# Patient Record
Sex: Female | Born: 1985 | Race: White | Hispanic: No | Marital: Single | Smoking: Current every day smoker
Health system: Southern US, Community
[De-identification: ages and names within clinical notes are randomized; demographics above are authoritative.]

## PROBLEM LIST (undated history)

## (undated) DIAGNOSIS — N289 Disorder of kidney and ureter, unspecified: Secondary | ICD-10-CM

## (undated) HISTORY — PX: LITHOTRIPSY: SUR834

## (undated) HISTORY — PX: CHOLECYSTECTOMY: SHX55

---
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
## 2006-09-09 ENCOUNTER — Emergency Department: Payer: Self-pay | Admitting: Emergency Medicine

## 2006-09-23 ENCOUNTER — Emergency Department: Payer: Self-pay | Admitting: Emergency Medicine

## 2006-09-23 ENCOUNTER — Other Ambulatory Visit: Payer: Self-pay

## 2007-03-15 ENCOUNTER — Emergency Department: Payer: Self-pay | Admitting: Emergency Medicine

## 2007-04-04 ENCOUNTER — Emergency Department: Payer: Self-pay | Admitting: Emergency Medicine

## 2007-07-18 ENCOUNTER — Emergency Department: Payer: Self-pay

## 2007-12-08 ENCOUNTER — Emergency Department: Payer: Self-pay | Admitting: Emergency Medicine

## 2008-02-02 ENCOUNTER — Emergency Department: Payer: Self-pay | Admitting: Emergency Medicine

## 2008-06-29 ENCOUNTER — Emergency Department: Payer: Self-pay | Admitting: Emergency Medicine

## 2008-07-09 ENCOUNTER — Ambulatory Visit: Payer: Self-pay | Admitting: Urology

## 2008-07-14 ENCOUNTER — Ambulatory Visit: Payer: Self-pay | Admitting: Urology

## 2009-06-08 ENCOUNTER — Emergency Department: Payer: Self-pay | Admitting: Emergency Medicine

## 2009-09-02 ENCOUNTER — Emergency Department: Payer: Self-pay | Admitting: Emergency Medicine

## 2010-01-28 ENCOUNTER — Emergency Department: Payer: Self-pay | Admitting: Emergency Medicine

## 2010-02-10 ENCOUNTER — Ambulatory Visit: Payer: Self-pay | Admitting: Surgery

## 2010-02-11 ENCOUNTER — Ambulatory Visit: Payer: Self-pay | Admitting: Surgery

## 2010-02-14 LAB — PATHOLOGY REPORT

## 2010-10-20 ENCOUNTER — Emergency Department: Payer: Self-pay | Admitting: Emergency Medicine

## 2010-11-18 ENCOUNTER — Ambulatory Visit: Payer: Self-pay | Admitting: Urology

## 2011-01-01 ENCOUNTER — Emergency Department: Payer: Self-pay | Admitting: Internal Medicine

## 2011-02-19 ENCOUNTER — Emergency Department: Payer: Self-pay | Admitting: Emergency Medicine

## 2011-04-26 ENCOUNTER — Emergency Department: Payer: Self-pay | Admitting: Emergency Medicine

## 2011-04-27 ENCOUNTER — Emergency Department: Payer: Self-pay | Admitting: Emergency Medicine

## 2011-06-07 ENCOUNTER — Observation Stay: Payer: Self-pay

## 2011-08-13 ENCOUNTER — Inpatient Hospital Stay: Payer: Self-pay

## 2011-08-13 LAB — URINALYSIS, COMPLETE
Bilirubin,UR: NEGATIVE
Ketone: NEGATIVE
Leukocyte Esterase: NEGATIVE
Nitrite: NEGATIVE
Ph: 9 (ref 4.5–8.0)
Protein: 30
RBC,UR: 2 /HPF (ref 0–5)
Specific Gravity: 1.011 (ref 1.003–1.030)

## 2011-08-13 LAB — CBC WITH DIFFERENTIAL/PLATELET
Basophil #: 0.1 10*3/uL (ref 0.0–0.1)
Basophil %: 0.4 %
Eosinophil #: 0.1 10*3/uL (ref 0.0–0.7)
Eosinophil %: 0.7 %
HCT: 32 % — ABNORMAL LOW (ref 35.0–47.0)
HGB: 11 g/dL — ABNORMAL LOW (ref 12.0–16.0)
Lymphocyte %: 13 %
Monocyte %: 1.9 %
Neutrophil #: 12 10*3/uL — ABNORMAL HIGH (ref 1.4–6.5)
Neutrophil %: 84 %
RDW: 12.5 % (ref 11.5–14.5)
WBC: 14.3 10*3/uL — ABNORMAL HIGH (ref 3.6–11.0)

## 2011-08-13 LAB — DRUG SCREEN, URINE
Amphetamines, Ur Screen: NEGATIVE (ref ?–1000)
Barbiturates, Ur Screen: NEGATIVE (ref ?–200)
Benzodiazepine, Ur Scrn: NEGATIVE (ref ?–200)
MDMA (Ecstasy)Ur Screen: NEGATIVE (ref ?–500)
Methadone, Ur Screen: NEGATIVE (ref ?–300)
Opiate, Ur Screen: NEGATIVE (ref ?–300)
Phencyclidine (PCP) Ur S: NEGATIVE (ref ?–25)

## 2011-08-13 LAB — FETAL FIBRONECTIN: Fetal Fibronectin: POSITIVE

## 2011-08-15 LAB — BETA STREP CULTURE(ARMC)

## 2011-08-16 ENCOUNTER — Observation Stay: Payer: Self-pay

## 2011-08-16 LAB — COMPREHENSIVE METABOLIC PANEL
Alkaline Phosphatase: 143 U/L — ABNORMAL HIGH (ref 50–136)
BUN: 4 mg/dL — ABNORMAL LOW (ref 7–18)
Bilirubin,Total: 0.2 mg/dL (ref 0.2–1.0)
Co2: 28 mmol/L (ref 21–32)
EGFR (African American): >60
EGFR (Non-African Amer.): >60
Glucose: 91 mg/dL (ref 65–99)
Osmolality: 280 (ref 275–301)
Potassium: 2.8 mmol/L — ABNORMAL LOW (ref 3.5–5.1)
SGOT(AST): 41 U/L — ABNORMAL HIGH (ref 15–37)
SGPT (ALT): 36 U/L
Total Protein: 5.3 g/dL — ABNORMAL LOW (ref 6.4–8.2)

## 2011-08-16 LAB — URINALYSIS, COMPLETE
Bacteria: NONE SEEN
Bilirubin,UR: NEGATIVE
Glucose,UR: NEGATIVE mg/dL (ref 0–75)
Ketone: NEGATIVE
Specific Gravity: 1.003 (ref 1.003–1.030)
WBC UR: 3 /HPF (ref 0–5)

## 2011-09-02 ENCOUNTER — Observation Stay: Payer: Self-pay

## 2011-09-17 ENCOUNTER — Observation Stay: Payer: Self-pay | Admitting: *Deleted

## 2011-09-19 ENCOUNTER — Inpatient Hospital Stay: Payer: Self-pay | Admitting: Obstetrics and Gynecology

## 2011-09-19 LAB — RAPID URINE DRUG SCREEN, HOSP PERFORMED
Amphetamines, Ur Screen: NEGATIVE (ref ?–1000)
Benzodiazepine, Ur Scrn: NEGATIVE (ref ?–200)
Cannabinoid 50 Ng, Ur ~~LOC~~: POSITIVE (ref ?–50)

## 2011-09-19 LAB — CBC WITH DIFFERENTIAL/PLATELET
Basophil #: 0.2 10*3/uL — ABNORMAL HIGH (ref 0.0–0.1)
Eosinophil #: 0.1 10*3/uL (ref 0.0–0.7)
HCT: 31.2 % — ABNORMAL LOW (ref 35.0–47.0)
Lymphocyte #: 3 10*3/uL (ref 1.0–3.6)
Lymphocyte %: 16.9 %
MCHC: 34.1 g/dL (ref 32.0–36.0)
MCV: 93 fL (ref 80–100)
Neutrophil #: 13.8 10*3/uL — ABNORMAL HIGH (ref 1.4–6.5)
Platelet: 293 10*3/uL (ref 150–440)
RBC: 3.37 10*6/uL — ABNORMAL LOW (ref 3.80–5.20)
RDW: 13.1 % (ref 11.5–14.5)

## 2011-09-20 LAB — HEMATOCRIT: HCT: 28.9 % — ABNORMAL LOW (ref 35.0–47.0)

## 2012-10-03 ENCOUNTER — Emergency Department: Payer: Self-pay | Admitting: Emergency Medicine

## 2012-10-03 LAB — URINALYSIS, COMPLETE
Bilirubin,UR: NEGATIVE
Glucose,UR: NEGATIVE mg/dL (ref 0–75)
Nitrite: NEGATIVE
Ph: 6 (ref 4.5–8.0)
Protein: 100
Specific Gravity: 1.024 (ref 1.003–1.030)
Squamous Epithelial: 113

## 2012-10-03 LAB — COMPREHENSIVE METABOLIC PANEL
Albumin: 4 g/dL (ref 3.4–5.0)
Alkaline Phosphatase: 109 U/L (ref 50–136)
Anion Gap: 5 — ABNORMAL LOW (ref 7–16)
Calcium, Total: 8.7 mg/dL (ref 8.5–10.1)
EGFR (African American): >60
Glucose: 97 mg/dL (ref 65–99)
Potassium: 4.1 mmol/L (ref 3.5–5.1)
Sodium: 135 mmol/L — ABNORMAL LOW (ref 136–145)

## 2012-10-03 LAB — CBC
HGB: 15.2 g/dL (ref 12.0–16.0)
MCHC: 33.8 g/dL (ref 32.0–36.0)
RBC: 5.05 10*6/uL (ref 3.80–5.20)
RDW: 13.9 % (ref 11.5–14.5)

## 2012-12-14 ENCOUNTER — Emergency Department: Payer: Self-pay | Admitting: Unknown Physician Specialty

## 2012-12-14 LAB — COMPREHENSIVE METABOLIC PANEL
Albumin: 3.9 g/dL (ref 3.4–5.0)
Alkaline Phosphatase: 129 U/L (ref 50–136)
Anion Gap: 5 — ABNORMAL LOW (ref 7–16)
BUN: 15 mg/dL (ref 7–18)
Bilirubin,Total: 0.3 mg/dL (ref 0.2–1.0)
Calcium, Total: 9.1 mg/dL (ref 8.5–10.1)
Chloride: 107 mmol/L (ref 98–107)
Co2: 23 mmol/L (ref 21–32)
EGFR (African American): >60
EGFR (Non-African Amer.): >60
Glucose: 94 mg/dL (ref 65–99)
Potassium: 4 mmol/L (ref 3.5–5.1)
SGPT (ALT): 36 U/L (ref 12–78)
Sodium: 135 mmol/L — ABNORMAL LOW (ref 136–145)
Total Protein: 7.5 g/dL (ref 6.4–8.2)

## 2012-12-14 LAB — CBC
MCH: 30.5 pg (ref 26.0–34.0)
MCHC: 34 g/dL (ref 32.0–36.0)
Platelet: 304 10*3/uL (ref 150–440)
RBC: 4.87 10*6/uL (ref 3.80–5.20)
WBC: 9.4 10*3/uL (ref 3.6–11.0)

## 2012-12-14 LAB — URINALYSIS, COMPLETE
Bacteria: NONE SEEN
Bilirubin,UR: NEGATIVE
Glucose,UR: NEGATIVE mg/dL (ref 0–75)
Ketone: NEGATIVE
Nitrite: NEGATIVE
Ph: 6 (ref 4.5–8.0)
Protein: 30
Specific Gravity: 1.017 (ref 1.003–1.030)

## 2013-02-19 ENCOUNTER — Ambulatory Visit: Payer: Self-pay | Admitting: Urology

## 2013-02-26 ENCOUNTER — Emergency Department: Payer: Self-pay | Admitting: Emergency Medicine

## 2013-02-28 LAB — BETA STREP CULTURE(ARMC)

## 2013-08-21 ENCOUNTER — Emergency Department: Payer: Self-pay | Admitting: Emergency Medicine

## 2013-08-21 LAB — PREGNANCY, URINE: Pregnancy Test, Urine: NEGATIVE m[IU]/mL

## 2013-08-21 LAB — URINALYSIS, COMPLETE
BILIRUBIN, UR: NEGATIVE
GLUCOSE, UR: NEGATIVE mg/dL (ref 0–75)
Ketone: NEGATIVE
NITRITE: NEGATIVE
Ph: 7 (ref 4.5–8.0)
Protein: 30
RBC,UR: 107 /HPF (ref 0–5)
SPECIFIC GRAVITY: 1.016 (ref 1.003–1.030)
WBC UR: 7 /HPF (ref 0–5)

## 2013-08-21 LAB — CBC WITH DIFFERENTIAL/PLATELET
BASOS ABS: 0 10*3/uL (ref 0.0–0.1)
BASOS PCT: 0.4 %
EOS ABS: 0.2 10*3/uL (ref 0.0–0.7)
Eosinophil %: 2.3 %
HCT: 43.4 % (ref 35.0–47.0)
HGB: 14.4 g/dL (ref 12.0–16.0)
Lymphocyte #: 2.4 10*3/uL (ref 1.0–3.6)
Lymphocyte %: 36.7 %
MCH: 29.9 pg (ref 26.0–34.0)
MCHC: 33.2 g/dL (ref 32.0–36.0)
MCV: 90 fL (ref 80–100)
MONO ABS: 0.5 x10 3/mm (ref 0.2–0.9)
MONOS PCT: 7.7 %
NEUTROS PCT: 52.9 %
Neutrophil #: 3.5 10*3/uL (ref 1.4–6.5)
Platelet: 265 10*3/uL (ref 150–440)
RBC: 4.82 10*6/uL (ref 3.80–5.20)
RDW: 13.3 % (ref 11.5–14.5)
WBC: 6.7 10*3/uL (ref 3.6–11.0)

## 2013-08-21 LAB — COMPREHENSIVE METABOLIC PANEL
ALT: 25 U/L (ref 12–78)
ANION GAP: 6 — AB (ref 7–16)
AST: 17 U/L (ref 15–37)
Albumin: 3.8 g/dL (ref 3.4–5.0)
Alkaline Phosphatase: 120 U/L — ABNORMAL HIGH
BUN: 7 mg/dL (ref 7–18)
Bilirubin,Total: 0.3 mg/dL (ref 0.2–1.0)
CHLORIDE: 107 mmol/L (ref 98–107)
CO2: 25 mmol/L (ref 21–32)
CREATININE: 0.68 mg/dL (ref 0.60–1.30)
Calcium, Total: 8.9 mg/dL (ref 8.5–10.1)
GLUCOSE: 84 mg/dL (ref 65–99)
Osmolality: 273 (ref 275–301)
POTASSIUM: 3.7 mmol/L (ref 3.5–5.1)
Sodium: 138 mmol/L (ref 136–145)
Total Protein: 7.3 g/dL (ref 6.4–8.2)

## 2013-08-21 LAB — LIPASE, BLOOD: LIPASE: 95 U/L (ref 73–393)

## 2013-08-23 LAB — URINE CULTURE

## 2013-09-22 ENCOUNTER — Emergency Department: Payer: Self-pay | Admitting: Emergency Medicine

## 2013-09-22 LAB — BASIC METABOLIC PANEL
ANION GAP: 5 — AB (ref 7–16)
BUN: 11 mg/dL (ref 7–18)
CALCIUM: 8.8 mg/dL (ref 8.5–10.1)
CREATININE: 0.82 mg/dL (ref 0.60–1.30)
Chloride: 107 mmol/L (ref 98–107)
Co2: 24 mmol/L (ref 21–32)
EGFR (African American): >60
GLUCOSE: 99 mg/dL (ref 65–99)
Osmolality: 271 (ref 275–301)
POTASSIUM: 3.9 mmol/L (ref 3.5–5.1)
Sodium: 136 mmol/L (ref 136–145)

## 2013-09-22 LAB — CBC WITH DIFFERENTIAL/PLATELET
BASOS PCT: 0.8 %
Basophil #: 0.1 10*3/uL (ref 0.0–0.1)
Eosinophil #: 0.2 10*3/uL (ref 0.0–0.7)
Eosinophil %: 2.1 %
HCT: 43.8 % (ref 35.0–47.0)
HGB: 14.8 g/dL (ref 12.0–16.0)
LYMPHS ABS: 2.8 10*3/uL (ref 1.0–3.6)
LYMPHS PCT: 32.7 %
MCH: 30.7 pg (ref 26.0–34.0)
MCHC: 33.8 g/dL (ref 32.0–36.0)
MCV: 91 fL (ref 80–100)
MONOS PCT: 7.1 %
Monocyte #: 0.6 x10 3/mm (ref 0.2–0.9)
NEUTROS PCT: 57.3 %
Neutrophil #: 4.9 10*3/uL (ref 1.4–6.5)
Platelet: 285 10*3/uL (ref 150–440)
RBC: 4.82 10*6/uL (ref 3.80–5.20)
RDW: 13.9 % (ref 11.5–14.5)
WBC: 8.5 10*3/uL (ref 3.6–11.0)

## 2013-10-10 ENCOUNTER — Emergency Department: Payer: Self-pay | Admitting: Nurse Practitioner

## 2013-12-23 ENCOUNTER — Emergency Department: Payer: Self-pay | Admitting: Internal Medicine

## 2014-08-04 ENCOUNTER — Emergency Department: Payer: Self-pay | Admitting: Emergency Medicine

## 2014-08-04 LAB — CBC WITH DIFFERENTIAL/PLATELET
BASOS PCT: 0.6 %
Basophil #: 0 10*3/uL (ref 0.0–0.1)
EOS ABS: 0.1 10*3/uL (ref 0.0–0.7)
Eosinophil %: 1.1 %
HCT: 45 % (ref 35.0–47.0)
HGB: 15.2 g/dL (ref 12.0–16.0)
Lymphocyte #: 2.3 10*3/uL (ref 1.0–3.6)
Lymphocyte %: 31.3 %
MCH: 31.4 pg (ref 26.0–34.0)
MCHC: 33.8 g/dL (ref 32.0–36.0)
MCV: 93 fL (ref 80–100)
MONO ABS: 0.4 x10 3/mm (ref 0.2–0.9)
Monocyte %: 5.3 %
Neutrophil #: 4.5 10*3/uL (ref 1.4–6.5)
Neutrophil %: 61.7 %
Platelet: 258 10*3/uL (ref 150–440)
RBC: 4.84 10*6/uL (ref 3.80–5.20)
RDW: 13.8 % (ref 11.5–14.5)
WBC: 7.3 10*3/uL (ref 3.6–11.0)

## 2014-08-04 LAB — URINALYSIS, COMPLETE
BILIRUBIN, UR: NEGATIVE
GLUCOSE, UR: NEGATIVE mg/dL (ref 0–75)
KETONE: NEGATIVE
NITRITE: NEGATIVE
Ph: 7 (ref 4.5–8.0)
Protein: NEGATIVE
RBC,UR: 2 /HPF (ref 0–5)
SPECIFIC GRAVITY: 1.013 (ref 1.003–1.030)

## 2014-08-04 LAB — COMPREHENSIVE METABOLIC PANEL
ALBUMIN: 3.7 g/dL (ref 3.4–5.0)
ALT: 26 U/L (ref 14–63)
AST: 25 U/L (ref 15–37)
Alkaline Phosphatase: 100 U/L (ref 46–116)
Anion Gap: 8 (ref 7–16)
BILIRUBIN TOTAL: 0.3 mg/dL (ref 0.2–1.0)
BUN: 14 mg/dL (ref 7–18)
CREATININE: 0.82 mg/dL (ref 0.60–1.30)
Calcium, Total: 8.8 mg/dL (ref 8.5–10.1)
Chloride: 107 mmol/L (ref 98–107)
Co2: 23 mmol/L (ref 21–32)
EGFR (African American): >60
EGFR (Non-African Amer.): >60
GLUCOSE: 83 mg/dL (ref 65–99)
Osmolality: 275 (ref 275–301)
Potassium: 4.3 mmol/L (ref 3.5–5.1)
Sodium: 138 mmol/L (ref 136–145)
TOTAL PROTEIN: 7 g/dL (ref 6.4–8.2)

## 2014-08-04 LAB — LIPASE, BLOOD: Lipase: 145 U/L (ref 73–393)

## 2014-11-01 NOTE — Op Note (Signed)
PATIENT NAME:  Melissa Larson, Melissa Larson MR#:  [id number] DATE OF BIRTH:  [birth year]  DATE OF PROCEDURE:  09/19/2011  PREOPERATIVE DIAGNOSES:  1. Intrauterine pregnancy at [redacted] weeks gestation.  2. Fetal intolerance of labor.   POSTOPERATIVE DIAGNOSES: 1. Intrauterine pregnancy at [redacted] weeks gestation.  2. Fetal intolerance of labor.   PROCEDURE: Low transverse Cesarean section via Pfannenstiel skin incision.   ANESTHESIA: Epidural.   SURGEON: Conard NovakStephen D. Aaleah Hirsch, MD   ASSISTANT SURGEON: Dierdre Searles. Paul Harris, MD   ESTIMATED BLOOD LOSS: 750 mL.  OPERATIVE FLUIDS: 900 mL Crystalloid.   COMPLICATIONS: None.   FINDINGS:  1. Viable infant with Apgars of 9 at one minute and 9 at five minutes.  2. Normal appearing uterus, tubes, and ovaries.   SPECIMENS: None.   CONDITION: Stable.  INDICATIONS FOR PROCEDURE: The patient is a 29 year old gravida 1 at 37 weeks, 1 day gestation. She presented to [facility] today with rupture of membranes with no onset of labor. She was given a time period before augmentation was undertaken and she had no cervical change. During augmentation very little Pitocin was tolerated by the fetus with multiple decelerations to the 90's occurring at very low dose Pitocin. Intrauterine resuscitative measures were undertaken and the fetus always responded well to these and always responded well to fetal scalp stimulation. However, the patient was distant from delivery with prolonged decelerations lasting 7 or 8 minutes and only relieved by knee-chest positioning and terbutaline. Given this, the need for Cesarean delivery was explained to the patient. The risks and benefits were explained to her and the reasoning behind the need for the Cesarean section were explained. Her questions were answered and she agreed to go to the operating room for operative delivery.   PROCEDURE IN DETAIL: The patient was taken to the operating room where epidural anesthesia was administered and found to be adequate.  She was placed in the dorsal supine position with a leftward tilt. She was prepped and draped in the usual sterile fashion. A Pfannenstiel incision was made and carried through the various layers until the peritoneum was identified and entered sharply. The peritoneal incision was extended both cranially and caudally. The bladder blade was placed and a bladder flap was created and the bladder blade was replaced to separate the bladder from the operative area. A low transverse incision was made on the uterus and clear fluid was noted. The incision was extended with both cranial and caudal tension. The fetal vertex was grasped and delivered through the hysterotomy without difficulty followed by the shoulders and the rest of the body. The cord was clamped and cut and the infant was handed to the awaiting pediatrician. Cord blood was taken. The placenta was delivered spontaneously intact with a three vessel cord. The uterus was exteriorized and cleared of all clots and debris. The hysterotomy was closed using a #0 Vicryl in a running locked fashion. A second layer of the same suture was used to obtain hemostasis. One additional figure-of-eight stitch was needed to be thrown on the uterus itself to obtain hemostasis. The uterus was returned to the abdomen and the gutters were irrigated. The peritoneum was closed with #3-0 Vicryl in a running fashion.   The On-Q system at this point was then placed with one catheter placed approximately 4 cm above the skin incision and 1 cm right and lateral to the midline. A second catheter was placed 4 cm superior to the skin incision and 1 cm left of midline and thess  catheters were placed at the level just superficial to the rectus muscle. The catheters were inserted to approximately the third mark on the catheter.   The fascia was closed using #0 PDS with care not to incorporate the On-Q catheters. The skin was closed with staples. The On-Q catheters were bolused with 5 mL of 0.5%  Marcaine plain in each catheter.   The On-Q catheters were affixed to the skin using Dermabond at the site of insertion on both catheters. Once the Dermabond had dried, the catheters were then affixed to the skin using Steri-Strips and a Tegaderm was placed to further maintain the position of the catheters away from the incision.    The patient tolerated the procedure well. Sponge, lap, and needle counts were correct x2. She received preoperative antibiotics in the form of Ancef  2 grams prior to skin incision. She was taken from the operating room to recovery in stable condition.    ____________________________ Conard Novak, MD sdj:drc D: 09/19/2011 23:21:00 ET T: 09/20/2011 11:53:13 ET JOB#: [id number]  cc: Conard Novak, MD, <Dictator> Conard Novak MD ELECTRONICALLY SIGNED 10/01/2011 23:21

## 2014-11-01 NOTE — Consult Note (Signed)
    Maternal Age 29    Gravida 1    Para 0    Term 0    PreTerm 0    Abortion 0    Living 0    EDC 09-Oct-2011    Gestational Age (wks, days) 32 1/7    Gestation Single    Maternal Blood Type O    Maternal Rh Positive    Indirect Coombs Negative    Maternal HIV Negative    Maternal Syphilis Ab Nonreactive    Maternal Rubella Immune    Maternal HBsAg Negative    Maternal GBS Negative    Other Maternal Labs Chl- and gon -    Prenatal Care Adequate    Family/Social History Please see below consult for psych history     Additional Comments Called to consult on this female 7732 1/[redacted] week gestation age female thought to be approx 4lbs by u/s.  Mother is a 29yo G1PO with significant prenatal history of bipolar disorder, major depressive disorder, (currently no psychiatric medication), with psychogenic syncopal espisodes, currently smoking 1ppd of cigarettes, and using marijuana.  The mother, her sister in law, and I discussed the risks of premature birth. We reviewed the common causes for preterm labor. We discussed the mortality and morbidity risks of an infant born at 8832wks gestation age. We discussed neurodevelopmental outcomes, eye development, lung development, feeding difficulties, thermoregulation, antibiotic coverage, and length of stay. We discussed the initial resuscitation and the average NICU course of a [redacted] wk ga female. I answered all of mother???s questions and empowered her to continue to ask questions regarding her preterm labor and infant. We discussed her psychiatric conditions as well. She is currently stable off of medication and lives at home by herself. Her home environment is currently stable. I discussed that we would have social work involved in order to assess the safety of her environment for her new infant.  This mother was very appropriate and asked very detailed and pertinent questions. Despite her significant psychiatric history, this mother has very good  insight regarding her and her unborn infants health.Thank you for the opportunity to participate in this patients care.   Parental Contact: Parental Contact: The parents were informed at length regarding the infant's condition and plan.  Thank you: Thank you for this consult..  Electronic Signatures: Corliss ParishGaliote, Matteson Blue P (MD)  (Signed [phone]-Feb-13 15:24)  Authored: PREGNANCY and LABOR, ADDITIONAL COMMENTS, PARENTAL CONTACT, THANK YOU   Last Updated: 03-Feb-13 15:24 by Corliss ParishGaliote, Tamani Durney P (MD)

## 2014-11-17 NOTE — H&P (Signed)
L&D Evaluation:  History Expanded:   HPI 29 year old GG1 P0 with EDC=10/09/2011 by a 7week ultrasound presents with c/o pain in vagina for 2-3 hours. Baby has been active. Has had occasional vomiting thru pregnancy. Vomited once this am. [facility], [facility]. 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 LABS:O POS, RI, VI, HIV neg, RPR neg, and HBsag neg.    Patient's Medical History Bipolar/depressive disorder, obesity, CIN2, kidney stones    Patient's Surgical History Cholecystectomy 2011, lithotripsy 2010    Medications Pre Natal Vitamins    Allergies ASA-anaphyllaxis    Social History tobacco  drugs  +UDS for MJ. smokes 1 PPD (down from 1 PPD)    Family History Non-Contributory   ROS:   ROS see HPI   Exam:   Vital Signs stable    Urine Protein 1+    General no apparent distress    Mental Status clear    Abdomen gravid, non-tender    Edema no edema    Pelvic no external lesions, 1.5/75/-1.    Mebranes Intact    FHT normal rate with no decels    Ucx absent    Skin dry   Impression:   Impression No evidence of preterm labor, cervix unchanged from prior exams   Plan:   Plan EFM/NST, discharge    Comments f/u at office on 2/28 as previously scheduled   Electronic Signatures: Vella KohlerBrothers, Kynadie Yaun K (CNM)  (Signed 23-Feb-13 17:35)  Authored: L&D Evaluation   Last Updated: 23-Feb-13 17:35 by Vella KohlerBrothers, Yonah Tangeman K (CNM)

## 2014-11-17 NOTE — H&P (Signed)
L&D Evaluation:  History:   HPI 29 year old GG1 P0 with EDC=10/09/2011 by a 7week ultrasound presents with c/o lower abdominal menstrual cramping since last night. This AM also had a brown discharge. Baby has been active. Some dysuria yesterday. Denies diarrhea, unusual vaginal discharge or fever. Has had occasional vomiting thru pregnancy. Vomited once yesterday but able to tolerate regular diet since.[facility] care [facility], [facility]. 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 LABS:O POS, RI, VI, HIV neg, RPR neg, and HBsag neg.    Presents with abdominal pain, and spotting    Patient's Medical History Bipolar/depressive disorder, obesity, CIN2, kidney stones    Patient's Surgical History Cholecystectomy 2011, lithotripsy 2010    Medications Pre Natal Vitamins    Allergies ASA-anaphyllaxis    Social History tobacco  drugs  +UDS for MJ. smokes 1 PPD (down from 1 PPD)    Family History Non-Contributory   ROS:   ROS see HPI   Exam:   Vital Signs stable    Urine Protein 1+, 1.011, 2RBCs/HPF, 4WBC/HPF, no bacteria., 23 epi cells.    General no apparent distress    Mental Status clear    Chest clear    Heart normal sinus rhythm, no murmur/gallop/rubs    Abdomen gravid, non-tender    Estimated Fetal Weight On USCGA=32wk1day, EFW4#6oz    Fetal Position cephalic on US    Edema no edema    Reflexes 1+    Pelvic no external lesions, 1.5/60-70%/-1. wet prep negative.    Mebranes Intact, AFI=12cm    FHT 135 with accels to 150s to 170, mod variability    FHT Description occ mild variables x 10-30 sec with ctxs?    Ucx irregular, q10 min, difficult to see with toco    Skin dry   Impression:   Impression IUP at 31 6/7  weeks with preterm labor.   Plan:   Plan EFM/NST, monitor contractions and for cervical change, FFN, Aptima, GBS culture done. Magnesium sulfate tocolysis. GBS prophyllaxis.  BMZ. Notified neonatal and Dr Bonney AidStaebler of admission..    Comments FFN came back positive.   Electronic Signatures: Trinna BalloonGutierrez, Matan Steen L (CNM)  (Signed 03-Feb-13 14:33)  Authored: L&D Evaluation   Last Updated: 03-Feb-13 14:33 by Trinna BalloonGutierrez, Ithan Touhey L (CNM)

## 2014-11-17 NOTE — H&P (Signed)
L&D Evaluation:  History:   HPI 29 yo G1 at 2742w1d by first trimester ultrasound dating.  Pregnancy complicated by late entry to care, abnormal pap smear (CIN2 on colposcopy), smoking, + marijuana use during pregnancy, preterm labor at 32 weeks (now s/p betamethasone), Depression, obesity with BMI >40 weeks. She presents with concern for rupture of membranes today at noon.  Clear fluid.  +FM, no vaginal bleeding, she is unsure if she is having contractions.  O+, RI, RPR NR, HBsAg neg, VZ I, GBS pos.    Presents with leaking fluid    Patient's Medical History obesity, Major depression/bipolar, abnormal pap smear    Patient's Surgical History laparoscopic Cholecystectomy, lithotripsy    Medications Pre Natal Vitamins    Allergies aspirin-throat swelling    Social History tobacco  drugs  +marijuana use    Family History Non-Contributory   ROS:   ROS All systems were reviewed.  HEENT, CNS, GI, GU, Respiratory, CV, Renal and Musculoskeletal systems were found to be normal.   Exam:   Vital Signs stable  AFVSS    General no apparent distress    Mental Status clear    Chest clear    Abdomen gravid, non-tender    Estimated Fetal Weight Average for gestational age    Fetal Position cephalic    Back no CVAT    Edema no edema    Pelvic no external lesions, 3/70/-1 (per RN)    Mebranes Ruptured    Description clear    FHT normal rate with no decels    FHT Description Variable decelerations, 135/+accels/no decels/mod variability    Ucx irregular, infrequent    Skin no lesions, multiple leg tattoos    Lymph no lymphadenopathy   Impression:   Impression PROM   Plan:   Plan EFM/NST, monitor contractions and for cervical change, monitor BP, antibiotics for GBBS prophylaxis, fluids    Comments admit expectant management for a few hours. if no labor, will augment clear liquid diet t&s/cbc   Electronic Signatures: Conard NovakJackson, Stefano Trulson D (MD)  (Signed 12-Mar-13  14:11)  Authored: L&D Evaluation   Last Updated: 12-Mar-13 14:11 by Conard NovakJackson, Epimenio Schetter D (MD)

## 2015-01-28 ENCOUNTER — Encounter: Payer: Self-pay | Admitting: Emergency Medicine

## 2015-01-28 ENCOUNTER — Emergency Department
Admission: EM | Admit: 2015-01-28 | Discharge: 2015-01-28 | Disposition: A | Discharge status: Home / Self Care | Payer: Self-pay | Attending: Emergency Medicine | Admitting: Emergency Medicine

## 2015-01-28 DIAGNOSIS — L02412 Cutaneous abscess of left axilla: Secondary | ICD-10-CM | POA: Insufficient documentation

## 2015-01-28 DIAGNOSIS — Z720 Tobacco use: Secondary | ICD-10-CM | POA: Insufficient documentation

## 2015-01-28 MED ORDER — SULFAMETHOXAZOLE-TRIMETHOPRIM 800-160 MG PO TABS: 1.0000 | ORAL_TABLET | Freq: Two times a day (BID) | ORAL | Status: AC

## 2015-01-28 MED ORDER — KETOROLAC TROMETHAMINE 10 MG PO TABS
ORAL_TABLET | ORAL | Status: AC
  Filled 2015-01-28: qty 1

## 2015-01-28 MED ORDER — OXYCODONE-ACETAMINOPHEN 5-325 MG PO TABS
1.0000 | ORAL_TABLET | Freq: Once | ORAL | Status: AC
  Administered 2015-01-28: 1 via ORAL
  Filled 2015-01-28: qty 1

## 2015-01-28 MED ORDER — SULFAMETHOXAZOLE-TRIMETHOPRIM 800-160 MG PO TABS
2.0000 | ORAL_TABLET | Freq: Once | ORAL | Status: AC
  Administered 2015-01-28: 2 via ORAL
  Filled 2015-01-28: qty 2

## 2015-01-28 NOTE — ED Notes (Signed)
Patient ambulatory to triage with steady gait, without difficulty or distress noted; pt st noted bump to left axillae, now with redness and pain; denies hx of same

## 2015-01-28 NOTE — Discharge Instructions (Signed)

## 2015-01-28 NOTE — ED Provider Notes (Signed)
[facility] Emergency Department Provider Note  ____________________________________________  Time seen: 3:00 AM  I have reviewed the triage vital signs and the nursing notes.   HISTORY  Chief Complaint Abscess     HPI Melissa Larson is a 29 y.o. female resents with left axilla abscess 2 days. Patient stated tender swollen area with greenish yellow drainage which she noticed proximally 2 days ago. Patient denies any fever no nausea no vomiting. In addition patient denies any previous abscess history   Past Medical history None There are no active problems to display for this patient.   Past Surgical History  Procedure Laterality Date  . Lithotripsy    . Cholecystectomy    . Cesarean section      No current outpatient prescriptions on file.  Allergies Aspirin  No family history on file.  Social History History  Substance Use Topics  . Smoking status: Current Every Day Smoker -- 0.50 packs/day    Types: Cigarettes  . Smokeless tobacco: Not on file  . Alcohol Use: No    Review of Systems  Constitutional: Negative for fever. Eyes: Negative for visual changes. ENT: Negative for sore throat. Cardiovascular: Negative for chest pain. Respiratory: Negative for shortness of breath. Gastrointestinal: Negative for abdominal pain, vomiting and diarrhea. Genitourinary: Negative for dysuria. Musculoskeletal: Negative for back pain. Skin: Positive for abscess left axilla Neurological: Negative for headaches, focal weakness or numbness.   10-point ROS otherwise negative.  ____________________________________________   PHYSICAL EXAM:  VITAL SIGNS: ED Triage Vitals  Enc Vitals Group     BP 01/28/15 0050 119/88 mmHg     Pulse Rate 01/28/15 0050 73     Resp 01/28/15 0050 18     Temp 01/28/15 0050 98.2 F (36.8 C)     Temp Source 01/28/15 0050 Oral     SpO2 01/28/15 0050 99 %     Weight 01/28/15 0050 215 lb (97.523 kg)     Height  01/28/15 0050 4\' 7"  (1.397 m)     Head Cir --      Peak Flow --      Pain Score 01/28/15 0050 10     Pain Loc --      Pain Edu? --      Excl. in GC? --      Constitutional: Alert and oriented. Well appearing and in no distress. Eyes: Conjunctivae are normal. PERRL. Normal extraocular movements. ENT   Head: Normocephalic and atraumatic.   Nose: No congestion/rhinnorhea.   Mouth/Throat: Mucous membranes are moist.   Neck: No stridor. Cardiovascular: Normal rate, regular rhythm. Normal and symmetric distal pulses are present in all extremities. No murmurs, rubs, or gallops. Respiratory: Normal respiratory effort without tachypnea nor retractions. Breath sounds are clear and equal bilaterally. No wheezes/rales/rhonchi. Gastrointestinal: Soft and nontender. No distention. There is no CVA tenderness. Genitourinary: deferred Musculoskeletal: Nontender with normal range of motion in all extremities. No joint effusions.  No lower extremity tenderness nor edema. Neurologic:  Normal speech and language. No gross focal neurologic deficits are appreciated. Speech is normal.  Skin:  Left axilla lesion noted with surrounding erythema and central purulent drainage consistent with draining abscess. It surrounding cellulitis entire diameter approximately 0.5 cm. Psychiatric: Mood and affect are normal. Speech and behavior are normal. Patient exhibits appropriate insight and judgment.  ____________________________________________      INITIAL IMPRESSION / ASSESSMENT AND PLAN / ED COURSE  Pertinent labs & imaging results that were available during my care of the patient  were reviewed by me and considered in my medical decision making (see chart for details).  History of physical exam consistent with a draining abscess of the left axilla. As such I&D was not performed patient received Bactrim DS and will be prescribed the same at  home.  ____________________________________________   FINAL CLINICAL IMPRESSION(S) / ED DIAGNOSES  Final diagnoses:  Abscess of left axilla      Darci Current, MD 01/28/15 [phone]

## 2015-02-13 ENCOUNTER — Emergency Department
Admission: EM | Admit: 2015-02-13 | Discharge: 2015-02-13 | Disposition: A | Discharge status: Home / Self Care | Payer: Self-pay | Attending: Emergency Medicine | Admitting: Emergency Medicine

## 2015-02-13 ENCOUNTER — Encounter: Payer: Self-pay | Admitting: Emergency Medicine

## 2015-02-13 ENCOUNTER — Emergency Department: Payer: Self-pay

## 2015-02-13 DIAGNOSIS — G5761 Lesion of plantar nerve, right lower limb: Secondary | ICD-10-CM | POA: Insufficient documentation

## 2015-02-13 DIAGNOSIS — Z720 Tobacco use: Secondary | ICD-10-CM | POA: Insufficient documentation

## 2015-02-13 MED ORDER — NAPROXEN 500 MG PO TABS: 500.0000 mg | ORAL_TABLET | Freq: Two times a day (BID) | ORAL | Status: DC

## 2015-02-13 NOTE — ED Notes (Signed)
States she woke up with pain to the top of right foot.Marland Kitchendenies any recent injury  But states pain is increased bearing wt. No swelling noted

## 2015-02-13 NOTE — ED Provider Notes (Signed)
[facility] Emergency Department Provider Note  ____________________________________________  Time seen: Approximately 1:41 PM  I have reviewed the triage vital signs and the nursing notes.   HISTORY  Chief Complaint Foot Pain    HPI Melissa Larson is a 29 y.o. female patient complaining of acute onset of right plantar foot pain between the first and second metatarsal head. Patient stated this occurred upon awakening this morning. Patient denies any history of injury. He denies any increased physical activity. Patient denies any swelling or redness. . Patient state pain with ambulation. She rated the pain as 6/10. Patient describes the pain as sharp. State pain is slightly deviated by walking on the lateral aspect of her foot.  History reviewed. No pertinent past medical history.  There are no active problems to display for this patient.   Past Surgical History  Procedure Laterality Date  . Lithotripsy    . Cholecystectomy    . Cesarean section      Current Outpatient Rx  Name  Route  Sig  Dispense  Refill  . naproxen (NAPROSYN) 500 MG tablet   Oral   Take 1 tablet (500 mg total) by mouth 2 (two) times daily with a meal.   20 tablet   0     Allergies Aspirin  History reviewed. No pertinent family history.  Social History History  Substance Use Topics  . Smoking status: Current Every Day Smoker -- 0.50 packs/day    Types: Cigarettes  . Smokeless tobacco: Not on file  . Alcohol Use: No    Review of Systems Constitutional: No fever/chills Eyes: No visual changes. ENT: No sore throat. Cardiovascular: Denies chest pain. Respiratory: Denies shortness of breath. Gastrointestinal: No abdominal pain.  No nausea, no vomiting.  No diarrhea.  No constipation. Genitourinary: Negative for dysuria. Musculoskeletal: Right plantar foot pain. Skin: Negative for rash. Neurological: Negative for headaches, focal weakness or numbness. 10-point ROS  otherwise negative.  ____________________________________________   PHYSICAL EXAM:  VITAL SIGNS: ED Triage Vitals  Enc Vitals Group     BP 02/13/15 1327 120/73 mmHg     Pulse Rate 02/13/15 1327 84     Resp 02/13/15 1327 20     Temp 02/13/15 1327 98.1 F (36.7 C)     Temp Source 02/13/15 1327 Oral     SpO2 02/13/15 1327 98 %     Weight 02/13/15 1327 215 lb (97.523 kg)     Height 02/13/15 1327  (1.397 m)     Head Cir --      Peak Flow --      Pain Score 02/13/15 1326 6     Pain Loc --      Pain Edu? --      Excl. in GC? --     Constitutional: Alert and oriented. Well appearing and in no acute distress. Eyes: Conjunctivae are normal. PERRL. EOMI. Head: Atraumatic. Nose: No congestion/rhinnorhea. Mouth/Throat: Mucous membranes are moist.  Oropharynx non-erythematous. Neck: No stridor.  No cervical spine tenderness to palpation. Hematological/Lymphatic/Immunilogical: No cervical lymphadenopathy. Cardiovascular: Normal rate, regular rhythm. Grossly normal heart sounds.  Good peripheral circulation. Respiratory: Normal respiratory effort.  No retractions. Lungs CTAB. Gastrointestinal: Soft and nontender. No distention. No abdominal bruits. No CVA tenderness. Musculoskeletal: No obvious edema or erythema to the plantar aspect right foot. Patient has some moderate guarding palpation between the first and second metatarsal head. Neurovascular intact free nuchal range of motion.  Neurologic:  Normal speech and language. No gross focal neurologic  deficits are appreciated. No gait instability. Skin:  Skin is warm, dry and intact. No rash noted. Psychiatric: Mood and affect are normal. Speech and behavior are normal.  ____________________________________________   LABS (all labs ordered are listed, but only abnormal results are displayed)  Labs Reviewed - No data to  display ____________________________________________  EKG   ____________________________________________  RADIOLOGY  No acute finding on x-ray.  I, Joni Reining, personally viewed and evaluated these images as part of my medical decision making.   ____________________________________________   PROCEDURES  Procedure(s) performed: None  Critical Care performed: No  ____________________________________________   INITIAL IMPRESSION / ASSESSMENT AND PLAN / ED COURSE  Pertinent labs & imaging results that were available during my care of the patient were reviewed by me and considered in my medical decision making (see chart for details). Right plantar foot pain. Discuss x-ray results with patient. Advised patient to follow-up with podiatrist if condition continues and received anti-inflammatory medications. ____________________________________________   FINAL CLINICAL IMPRESSION(S) / ED DIAGNOSES  Final diagnoses:  Morton neuroma, right      Joni Reining, PA-C 02/13/15 1444  Joni Reining, PA-C 02/13/15 1448  Myrna Blazer, MD 02/13/15 [phone]

## 2015-02-13 NOTE — Discharge Instructions (Signed)
Morton's Neuroma Neuralgia (nerve pain) or neuroma (benign [non-cancerous] nerve tumor) may develop on any interdigital nerve. The interdigital nerves (nerves between digits) of the foot travel beneath and between the metatarsals (long bones of the fore foot) and pass the nerve endings to the toes. The third interdigital is a common place for a small neuroma to form called Morton's neuroma. Another nerve to be affected commonly is the fourth interdigital nerve. This would be in approximately in the area of the base or ball under the bottom of your fourth toe. This condition occurs more commonly in women and is usually on one side. It is usually first noticed by pain radiating (spreading) to the ball of the foot or to the toes. CAUSES The cause of interdigital neuralgia may be from low grade repetitive trauma (damage caused by an accident) as in activities causing a repeated pounding of the foot (running, jumping etc.). It is also caused by improper footwear or recent loss of the fatty padding on the bottom of the foot. TREATMENT  The condition often resolves (goes away) simply with decreasing activity if that is thought to be the cause. Proper shoes are beneficial. Orthotics (special foot support aids) such as a metatarsal bar are often beneficial. This condition usually responds to conservative therapy, however if surgery is necessary it usually brings complete relief. HOME CARE INSTRUCTIONS   Apply ice to the area of soreness for 15-20 minutes, 03-04 times per day, while awake for the first 2 days. Put ice in a plastic bag and place a towel between the bag of ice and your skin.  Only take over-the-counter or prescription medicines for pain, discomfort, or fever as directed by your caregiver. MAKE SURE YOU:   Understand these instructions.  Will watch your condition.  Will get help right away if you are not doing well or get worse. Document Released: 10/02/2000 Document Revised: 09/18/2011  Document Reviewed: 08/27/2013 [employer] Patient Information 2015 [employer]. This information is not intended to replace advice given to you by your health care provider. Make sure you discuss any questions you have with your health care provider.  

## 2015-02-13 NOTE — ED Notes (Signed)
Patient to ED with c/o right foot pain, reports waking up like that this morning and pain is worse when she puts pressure on it.

## 2015-02-15 ENCOUNTER — Emergency Department
Admission: EM | Admit: 2015-02-15 | Discharge: 2015-02-15 | Disposition: A | Discharge status: Home / Self Care | Payer: Self-pay | Attending: Emergency Medicine | Admitting: Emergency Medicine

## 2015-02-15 ENCOUNTER — Encounter: Payer: Self-pay | Admitting: Emergency Medicine

## 2015-02-15 DIAGNOSIS — J069 Acute upper respiratory infection, unspecified: Secondary | ICD-10-CM | POA: Insufficient documentation

## 2015-02-15 DIAGNOSIS — K644 Residual hemorrhoidal skin tags: Secondary | ICD-10-CM | POA: Insufficient documentation

## 2015-02-15 DIAGNOSIS — Z720 Tobacco use: Secondary | ICD-10-CM | POA: Insufficient documentation

## 2015-02-15 MED ORDER — HYDROCORTISONE 2.5 % RE CREA
TOPICAL_CREAM | RECTAL | Status: AC
Start: 2015-02-15 — End: 2016-02-15

## 2015-02-15 MED ORDER — GUAIFENESIN-CODEINE 100-10 MG/5ML PO SOLN: 10.0000 mL | ORAL | Status: AC | PRN

## 2015-02-15 MED ORDER — CHLORPHENIRAMINE MALEATE 4 MG PO TABS: 4.0000 mg | ORAL_TABLET | Freq: Two times a day (BID) | ORAL | Status: AC | PRN

## 2015-02-15 NOTE — ED Notes (Signed)
States she has had runny nose  Sore throat  Body aches for several days

## 2015-02-15 NOTE — ED Provider Notes (Signed)
[facility] Emergency Department Provider Note  ____________________________________________  Time seen: Approximately 3:39 PM  I have reviewed the triage vital signs and the nursing notes.   HISTORY  Chief Complaint URI   HPI Melissa Larson is a 29 y.o. female who presents with a one-day history of cough and runny nose bodyaches sore throat. Also complains of a 6 week history of burning in her "butt". Patient denies any bloody stools. Patient states that she's been having hot and cold flashes.   History reviewed. No pertinent past medical history.  There are no active problems to display for this patient.   Past Surgical History  Procedure Laterality Date  . Lithotripsy    . Cholecystectomy    . Cesarean section      Current Outpatient Rx  Name  Route  Sig  Dispense  Refill  . chlorpheniramine (CHLOR-TRIMETON) 4 MG tablet   Oral   Take 1 tablet (4 mg total) by mouth 2 (two) times daily as needed for allergies or rhinitis.   30 tablet   0   . guaiFENesin-codeine 100-10 MG/5ML syrup   Oral   Take 10 mLs by mouth every 4 (four) hours as needed for cough.   180 mL   0   . hydrocortisone (ANUSOL-HC) 2.5 % rectal cream      Apply rectally 2 times daily   30 g   1   . naproxen (NAPROSYN) 500 MG tablet   Oral   Take 1 tablet (500 mg total) by mouth 2 (two) times daily with a meal.   20 tablet   0     Allergies Aspirin  No family history on file.  Social History History  Substance Use Topics  . Smoking status: Current Every Day Smoker -- 0.50 packs/day    Types: Cigarettes  . Smokeless tobacco: Not on file  . Alcohol Use: No    Review of Systems Constitutional: No fever/chills. Positive for hot and cold flashes. Eyes: No visual changes. ENT: No sore throat. Cardiovascular: Denies chest pain. Respiratory: Denies shortness of breath. Positive for cough Gastrointestinal: No abdominal pain.  No nausea, no vomiting.  No diarrhea.   No constipation. Positive for pain and rectal area. Genitourinary: Negative for dysuria. Musculoskeletal: Negative for back pain. Skin: Negative for rash. Neurological: Negative for headaches, focal weakness or numbness.  10-point ROS otherwise negative.  ____________________________________________   PHYSICAL EXAM:  VITAL SIGNS: ED Triage Vitals  Enc Vitals Group     BP --      Pulse Rate 02/15/15 1537 69     Resp 02/15/15 1537 20     Temp 02/15/15 1537 98.7 F (37.1 C)     Temp Source 02/15/15 1537 Oral     SpO2 02/15/15 1537 98 %     Weight 02/15/15 1537 215 lb (97.523 kg)     Height 02/15/15 1537  (1.397 m)     Head Cir --      Peak Flow --      Pain Score 02/15/15 1538 6     Pain Loc --      Pain Edu? --      Excl. in GC? --     Constitutional: Alert and oriented. Well appearing and in no acute distress. Eyes: Conjunctivae are normal. PERRL. EOMI. Head: Atraumatic. Nose: No congestion/rhinnorhea. Mouth/Throat: Mucous membranes are moist.  Oropharynx non-erythematous. Neck: No stridor.   Cardiovascular: Normal rate, regular rhythm. Grossly normal heart sounds.  Good peripheral  circulation. Respiratory: Normal respiratory effort.  No retractions. Lungs CTAB. Gastrointestinal: Soft and nontender. No distention. No abdominal bruits. No CVA tenderness. Rectal exam demonstrates 1 external hemorrhoid. Musculoskeletal: No lower extremity tenderness nor edema.  No joint effusions. Neurologic:  Normal speech and language. No gross focal neurologic deficits are appreciated. No gait instability. Skin:  Skin is warm, dry and intact. No rash noted. Psychiatric: Mood and affect are normal. Speech and behavior are normal.  ____________________________________________   LABS (all labs ordered are listed, but only abnormal results are displayed)  Labs Reviewed - No data to display ____________________________________________    PROCEDURES  Procedure(s) performed:  None  Critical Care performed: No  ____________________________________________   INITIAL IMPRESSION / ASSESSMENT AND PLAN / ED COURSE  Pertinent labs & imaging results that were available during my care of the patient were reviewed by me and considered in my medical decision making (see chart for details).  Acute URI. External hemorrhoid. Rx given for Tessalon Perles 100 mg 3 times a day, chlorpheniramine 4 mg every 6 hours. Anusol HC cream. Follow up with PCP if symptoms get worse or return to the ER if needed. She voices no other emergency medical complaints at this visit. ____________________________________________   FINAL CLINICAL IMPRESSION(S) / ED DIAGNOSES  Final diagnoses:  URI, acute  External hemorrhoid      Evangeline Dakin, PA-C 02/15/15 1553  Maurilio Lovely, MD 02/16/15 0005

## 2015-02-15 NOTE — Discharge Instructions (Signed)
Cough, Adult  A cough is a reflex that helps clear your throat and airways. It can help heal the body or may be a reaction to an irritated airway. A cough may only last 2 or 3 weeks (acute) or may last more than 8 weeks (chronic).  CAUSES Acute cough:  Viral or bacterial infections. Chronic cough:  Infections.  Allergies.  Asthma.  Post-nasal drip.  Smoking.  Heartburn or acid reflux.  Some medicines.  Chronic lung problems (COPD).  Cancer. SYMPTOMS   Cough.  Fever.  Chest pain.  Increased breathing rate.  High-pitched whistling sound when breathing (wheezing).  Colored mucus that you cough up (sputum). TREATMENT   A bacterial cough may be treated with antibiotic medicine.  A viral cough must run its course and will not respond to antibiotics.  Your caregiver may recommend other treatments if you have a chronic cough. HOME CARE INSTRUCTIONS   Only take over-the-counter or prescription medicines for pain, discomfort, or fever as directed by your caregiver. Use cough suppressants only as directed by your caregiver.  Use a cold steam vaporizer or humidifier in your bedroom or home to help loosen secretions.  Sleep in a semi-upright position if your cough is worse at night.  Rest as needed.  Stop smoking if you smoke. SEEK IMMEDIATE MEDICAL CARE IF:   You have pus in your sputum.  Your cough starts to worsen.  You cannot control your cough with suppressants and are losing sleep.  You begin coughing up blood.  You have difficulty breathing.  You develop pain which is getting worse or is uncontrolled with medicine.  You have a fever. MAKE SURE YOU:   Understand these instructions.  Will watch your condition.  Will get help right away if you are not doing well or get worse. Document Released: 12/23/2010 Document Revised: 09/18/2011 Document Reviewed: 12/23/2010 [facility] Patient Information 2015 [place], [place]. This information is not intended  to replace advice given to you by your health care provider. Make sure you discuss any questions you have with your health care provider.  Hemorrhoids Hemorrhoids are swollen veins around the rectum or anus. There are two types of hemorrhoids:   Internal hemorrhoids. These occur in the veins just inside the rectum. They may poke through to the outside and become irritated and painful.  External hemorrhoids. These occur in the veins outside the anus and can be felt as a painful swelling or hard lump near the anus. CAUSES  Pregnancy.   Obesity.   Constipation or diarrhea.   Straining to have a bowel movement.   Sitting for long periods on the toilet.  Heavy lifting or other activity that caused you to strain.  Anal intercourse. SYMPTOMS   Pain.   Anal itching or irritation.   Rectal bleeding.   Fecal leakage.   Anal swelling.   One or more lumps around the anus.  DIAGNOSIS  Your caregiver may be able to diagnose hemorrhoids by visual examination. Other examinations or tests that may be performed include:   Examination of the rectal area with a gloved hand (digital rectal exam).   Examination of anal canal using a small tube (scope).   A blood test if you have lost a significant amount of blood.  A test to look inside the colon (sigmoidoscopy or colonoscopy). TREATMENT Most hemorrhoids can be treated at home. However, if symptoms do not seem to be getting better or if you have a lot of rectal bleeding, your caregiver may perform a  procedure to help make the hemorrhoids get smaller or remove them completely. Possible treatments include:   Placing a rubber band at the base of the hemorrhoid to cut off the circulation (rubber band ligation).   Injecting a chemical to shrink the hemorrhoid (sclerotherapy).   Using a tool to burn the hemorrhoid (infrared light therapy).   Surgically removing the hemorrhoid (hemorrhoidectomy).   Stapling the hemorrhoid  to block blood flow to the tissue (hemorrhoid stapling).  HOME CARE INSTRUCTIONS   Eat foods with fiber, such as whole grains, beans, nuts, fruits, and vegetables. Ask your doctor about taking products with added fiber in them (fibersupplements).  Increase fluid intake. Drink enough water and fluids to keep your urine clear or pale yellow.   Exercise regularly.   Go to the bathroom when you have the urge to have a bowel movement. Do not wait.   Avoid straining to have bowel movements.   Keep the anal area dry and clean. Use wet toilet paper or moist towelettes after a bowel movement.   Medicated creams and suppositories may be used or applied as directed.   Only take over-the-counter or prescription medicines as directed by your caregiver.   Take warm sitz baths for 15-20 minutes, 3-4 times a day to ease pain and discomfort.   Place ice packs on the hemorrhoids if they are tender and swollen. Using ice packs between sitz baths may be helpful.   Put ice in a plastic bag.   Place a towel between your skin and the bag.   Leave the ice on for 15-20 minutes, 3-4 times a day.   Do not use a donut-shaped pillow or sit on the toilet for long periods. This increases blood pooling and pain.  SEEK MEDICAL CARE IF:  You have increasing pain and swelling that is not controlled by treatment or medicine.  You have uncontrolled bleeding.  You have difficulty or you are unable to have a bowel movement.  You have pain or inflammation outside the area of the hemorrhoids. MAKE SURE YOU:  Understand these instructions.  Will watch your condition.  Will get help right away if you are not doing well or get worse. Document Released: 06/23/2000 Document Revised: 06/12/2012 Document Reviewed: 04/30/2012 [facility] Patient Information 2015 [place], [place]. This information is not intended to replace advice given to you by your health care provider. Make sure you discuss any questions  you have with your health care provider.  Upper Respiratory Infection, Adult An upper respiratory infection (URI) is also sometimes known as the common cold. The upper respiratory tract includes the nose, sinuses, throat, trachea, and bronchi. Bronchi are the airways leading to the lungs. Most people improve within 1 week, but symptoms can last up to 2 weeks. A residual cough may last even longer.  CAUSES Many different viruses can infect the tissues lining the upper respiratory tract. The tissues become irritated and inflamed and often become very moist. Mucus production is also common. A cold is contagious. You can easily spread the virus to others by oral contact. This includes kissing, sharing a glass, coughing, or sneezing. Touching your mouth or nose and then touching a surface, which is then touched by another person, can also spread the virus. SYMPTOMS  Symptoms typically develop 1 to 3 days after you come in contact with a cold virus. Symptoms vary from person to person. They may include:  Runny nose.  Sneezing.  Nasal congestion.  Sinus irritation.  Sore throat.  Loss of  voice (laryngitis).  Cough.  Fatigue.  Muscle aches.  Loss of appetite.  Headache.  Low-grade fever. DIAGNOSIS  You might diagnose your own cold based on familiar symptoms, since most people get a cold 2 to 3 times a year. Your caregiver can confirm this based on your exam. Most importantly, your caregiver can check that your symptoms are not due to another disease such as strep throat, sinusitis, pneumonia, asthma, or epiglottitis. Blood tests, throat tests, and X-rays are not necessary to diagnose a common cold, but they may sometimes be helpful in excluding other more serious diseases. Your caregiver will decide if any further tests are required. RISKS AND COMPLICATIONS  You may be at risk for a more severe case of the common cold if you smoke cigarettes, have chronic heart disease (such as heart  failure) or lung disease (such as asthma), or if you have a weakened immune system. The very young and very old are also at risk for more serious infections. Bacterial sinusitis, middle ear infections, and bacterial pneumonia can complicate the common cold. The common cold can worsen asthma and chronic obstructive pulmonary disease (COPD). Sometimes, these complications can require emergency medical care and may be life-threatening. PREVENTION  The best way to protect against getting a cold is to practice good hygiene. Avoid oral or hand contact with people with cold symptoms. Wash your hands often if contact occurs. There is no clear evidence that vitamin C, vitamin E, echinacea, or exercise reduces the chance of developing a cold. However, it is always recommended to get plenty of rest and practice good nutrition. TREATMENT  Treatment is directed at relieving symptoms. There is no cure. Antibiotics are not effective, because the infection is caused by a virus, not by bacteria. Treatment may include:  Increased fluid intake. Sports drinks offer valuable electrolytes, sugars, and fluids.  Breathing heated mist or steam (vaporizer or shower).  Eating chicken soup or other clear broths, and maintaining good nutrition.  Getting plenty of rest.  Using gargles or lozenges for comfort.  Controlling fevers with ibuprofen or acetaminophen as directed by your caregiver.  Increasing usage of your inhaler if you have asthma. Zinc gel and zinc lozenges, taken in the first 24 hours of the common cold, can shorten the duration and lessen the severity of symptoms. Pain medicines may help with fever, muscle aches, and throat pain. A variety of non-prescription medicines are available to treat congestion and runny nose. Your caregiver can make recommendations and may suggest nasal or lung inhalers for other symptoms.  HOME CARE INSTRUCTIONS   Only take over-the-counter or prescription medicines for pain,  discomfort, or fever as directed by your caregiver.  Use a warm mist humidifier or inhale steam from a shower to increase air moisture. This may keep secretions moist and make it easier to breathe.  Drink enough water and fluids to keep your urine clear or pale yellow.  Rest as needed.  Return to work when your temperature has returned to normal or as your caregiver advises. You may need to stay home longer to avoid infecting others. You can also use a face mask and careful hand washing to prevent spread of the virus. SEEK MEDICAL CARE IF:   After the first few days, you feel you are getting worse rather than better.  You need your caregiver's advice about medicines to control symptoms.  You develop chills, worsening shortness of breath, or brown or red sputum. These may be signs of pneumonia.  You develop  yellow or brown nasal discharge or pain in the face, especially when you bend forward. These may be signs of sinusitis.  You develop a fever, swollen neck glands, pain with swallowing, or white areas in the back of your throat. These may be signs of strep throat. SEEK IMMEDIATE MEDICAL CARE IF:   You have a fever.  You develop severe or persistent headache, ear pain, sinus pain, or chest pain.  You develop wheezing, a prolonged cough, cough up blood, or have a change in your usual mucus (if you have chronic lung disease).  You develop sore muscles or a stiff neck. Document Released: 12/20/2000 Document Revised: 09/18/2011 Document Reviewed: 10/01/2013 [facility] Patient Information 2015 [place], [place]. This information is not intended to replace advice given to you by your health care provider. Make sure you discuss any questions you have with your health care provider.

## 2015-02-15 NOTE — ED Notes (Signed)
Sore throat body aches ..runny nose .Marland Kitchenoccassional cough  Non productive

## 2015-05-05 ENCOUNTER — Encounter: Payer: Self-pay | Admitting: Emergency Medicine

## 2015-05-05 ENCOUNTER — Emergency Department
Admission: EM | Admit: 2015-05-05 | Discharge: 2015-05-05 | Disposition: A | Discharge status: Home / Self Care | Payer: Self-pay | Attending: Emergency Medicine | Admitting: Emergency Medicine

## 2015-05-05 DIAGNOSIS — Y9289 Other specified places as the place of occurrence of the external cause: Secondary | ICD-10-CM | POA: Insufficient documentation

## 2015-05-05 DIAGNOSIS — Y9389 Activity, other specified: Secondary | ICD-10-CM | POA: Insufficient documentation

## 2015-05-05 DIAGNOSIS — S0502XA Injury of conjunctiva and corneal abrasion without foreign body, left eye, initial encounter: Secondary | ICD-10-CM | POA: Insufficient documentation

## 2015-05-05 DIAGNOSIS — Z720 Tobacco use: Secondary | ICD-10-CM | POA: Insufficient documentation

## 2015-05-05 DIAGNOSIS — Z79899 Other long term (current) drug therapy: Secondary | ICD-10-CM | POA: Insufficient documentation

## 2015-05-05 DIAGNOSIS — Y998 Other external cause status: Secondary | ICD-10-CM | POA: Insufficient documentation

## 2015-05-05 HISTORY — DX: Disorder of kidney and ureter, unspecified: N28.9

## 2015-05-05 MED ORDER — ERYTHROMYCIN 5 MG/GM OP OINT
TOPICAL_OINTMENT | Freq: Once | OPHTHALMIC | Status: AC
  Administered 2015-05-05: 1 via OPHTHALMIC
  Filled 2015-05-05: qty 1

## 2015-05-05 MED ORDER — ERYTHROMYCIN 5 MG/GM OP OINT: TOPICAL_OINTMENT | Freq: Three times a day (TID) | OPHTHALMIC | Status: AC

## 2015-05-05 MED ORDER — FLUORESCEIN SODIUM 1 MG OP STRP
1.0000 | ORAL_STRIP | Freq: Once | OPHTHALMIC | Status: AC
  Administered 2015-05-05: 1 via OPHTHALMIC

## 2015-05-05 MED ORDER — TETRACAINE HCL 0.5 % OP SOLN
1.0000 [drp] | Freq: Once | OPHTHALMIC | Status: AC
  Administered 2015-05-05: 2 [drp] via OPHTHALMIC

## 2015-05-05 MED ORDER — TETRACAINE HCL 0.5 % OP SOLN
OPHTHALMIC | Status: AC
  Administered 2015-05-05: 2 [drp] via OPHTHALMIC
  Filled 2015-05-05: qty 2

## 2015-05-05 MED ORDER — OXYCODONE-ACETAMINOPHEN 5-325 MG PO TABS
ORAL_TABLET | ORAL | Status: AC
  Administered 2015-05-05: 1 via ORAL
  Filled 2015-05-05: qty 1

## 2015-05-05 MED ORDER — FLUORESCEIN SODIUM 1 MG OP STRP
ORAL_STRIP | OPHTHALMIC | Status: AC
  Administered 2015-05-05: 1 via OPHTHALMIC
  Filled 2015-05-05: qty 1

## 2015-05-05 MED ORDER — OXYCODONE-ACETAMINOPHEN 5-325 MG PO TABS
1.0000 | ORAL_TABLET | Freq: Once | ORAL | Status: AC
  Administered 2015-05-05: 1 via ORAL
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
## 2015-05-05 NOTE — ED Notes (Signed)
[facility]

## 2015-05-05 NOTE — Discharge Instructions (Signed)
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
## 2015-05-26 ENCOUNTER — Emergency Department
Admission: EM | Admit: 2015-05-26 | Discharge: 2015-05-26 | Disposition: A | Discharge status: Home / Self Care | Payer: Self-pay | Attending: Emergency Medicine | Admitting: Emergency Medicine

## 2015-05-26 DIAGNOSIS — T85848A Pain due to other internal prosthetic devices, implants and grafts, initial encounter: Secondary | ICD-10-CM

## 2015-05-26 DIAGNOSIS — T8484XA Pain due to internal orthopedic prosthetic devices, implants and grafts, initial encounter: Secondary | ICD-10-CM | POA: Insufficient documentation

## 2015-05-26 DIAGNOSIS — Z791 Long term (current) use of non-steroidal anti-inflammatories (NSAID): Secondary | ICD-10-CM | POA: Insufficient documentation

## 2015-05-26 DIAGNOSIS — F17210 Nicotine dependence, cigarettes, uncomplicated: Secondary | ICD-10-CM | POA: Insufficient documentation

## 2015-05-26 DIAGNOSIS — Z7952 Long term (current) use of systemic steroids: Secondary | ICD-10-CM | POA: Insufficient documentation

## 2015-05-26 DIAGNOSIS — K029 Dental caries, unspecified: Secondary | ICD-10-CM | POA: Insufficient documentation

## 2015-05-26 DIAGNOSIS — Y658 Other specified misadventures during surgical and medical care: Secondary | ICD-10-CM | POA: Insufficient documentation

## 2015-05-26 DIAGNOSIS — K0381 Cracked tooth: Secondary | ICD-10-CM | POA: Insufficient documentation

## 2015-05-26 MED ORDER — ONDANSETRON HCL 4 MG/2ML IJ SOLN
INTRAMUSCULAR | Status: AC
  Filled 2015-05-26: qty 2

## 2015-05-26 MED ORDER — PENICILLIN V POTASSIUM 500 MG PO TABS: 500.0000 mg | ORAL_TABLET | Freq: Four times a day (QID) | ORAL | Status: AC

## 2015-05-26 MED ORDER — TRAMADOL HCL 50 MG PO TABS: 50.0000 mg | ORAL_TABLET | Freq: Four times a day (QID) | ORAL | Status: AC | PRN

## 2015-05-26 NOTE — ED Provider Notes (Signed)
[facility] Emergency Department Provider Note  Time seen: 2:10 PM  I have reviewed the triage vital signs and the nursing notes.   HISTORY  Chief Complaint Dental Pain    HPI Melissa Larson is a 29 y.o. female with no past medical history presents to the emergency department with right-sided dental pain. According to the patient for the past 3-4 days she has had increased pain to her right lower molar. Patient states history of the same in the past. Denies fever, nausea, vomiting. Describes her pain as moderate, dull in quality. Worse with chewing.     Past Medical History  Diagnosis Date  . Renal disorder     There are no active problems to display for this patient.   Past Surgical History  Procedure Laterality Date  . Lithotripsy    . Cholecystectomy    . Cesarean section      Current Outpatient Rx  Name  Route  Sig  Dispense  Refill  . chlorpheniramine (CHLOR-TRIMETON) 4 MG tablet   Oral   Take 1 tablet (4 mg total) by mouth 2 (two) times daily as needed for allergies or rhinitis.   30 tablet   0   . guaiFENesin-codeine 100-10 MG/5ML syrup   Oral   Take 10 mLs by mouth every 4 (four) hours as needed for cough.   180 mL   0   . hydrocortisone (ANUSOL-HC) 2.5 % rectal cream      Apply rectally 2 times daily   30 g   1   . naproxen (NAPROSYN) 500 MG tablet   Oral   Take 1 tablet (500 mg total) by mouth 2 (two) times daily with a meal.   20 tablet   0     Allergies Aspirin  No family history on file.  Social History Social History  Substance Use Topics  . Smoking status: Current Every Day Smoker -- 0.50 packs/day    Types: Cigarettes  . Smokeless tobacco: None  . Alcohol Use: No    Review of Systems Constitutional: Negative for fever. ENT: Positive dental pain Cardiovascular: Negative for chest pain. Respiratory: Negative for shortness of breath. Gastrointestinal: Negative for abdominal pain, vomiting 10-point ROS  otherwise negative.  ____________________________________________   PHYSICAL EXAM:  VITAL SIGNS: ED Triage Vitals  Enc Vitals Group     BP 05/26/15 1345 122/78 mmHg     Pulse Rate 05/26/15 1344 77     Resp 05/26/15 1344 18     Temp 05/26/15 1344 98.7 F (37.1 C)     Temp Source 05/26/15 1344 Oral     SpO2 05/26/15 1344 98 %     Weight 05/26/15 1344 215 lb (97.523 kg)     Height 05/26/15 1344 4\' 7"  (1.397 m)     Head Cir --      Peak Flow --      Pain Score 05/26/15 1344 10     Pain Loc --      Pain Edu? --      Excl. in GC? --     Constitutional: Alert and oriented. Well appearing and in no distress. Eyes: Normal exam ENT   Head: Normocephalic and atraumatic.   Mouth/Throat: Mucous membranes are moist. Poor dentition. Dental caries with fractured tooth to the right lower molar. No signs of abscess. Cardiovascular: Normal rate, regular rhythm. No murmur Respiratory: Normal respiratory effort without tachypnea nor retractions. Breath sounds are clear  Gastrointestinal: Soft and nontender. No distention.  Musculoskeletal: Nontender with normal range of motion in all extremities. Neurologic:  Normal speech and language. No gross focal neurologic deficits  Skin:  Skin is warm, dry and intact.  Psychiatric: Mood and affect are normal. Speech and behavior are normal.  ____________________________________________    INITIAL IMPRESSION / ASSESSMENT AND PLAN / ED COURSE  Pertinent labs & imaging results that were available during my care of the patient were reviewed by me and considered in my medical decision making (see chart for details).  Dental pain with poor dentition, dental caries and fracture present. Discussed with the patient need to follow up with a dentist. Patient is agreeable. We'll provide a follow-up list of local dental clinics. We'll prescribe Ultram for pain control, and penicillin. Patient is agreeable to plan. No signs of abscess  currently.  ____________________________________________   FINAL CLINICAL IMPRESSION(S) / ED DIAGNOSES  Dental pain   Minna Antis, MD 05/26/15 [phone]

## 2015-05-26 NOTE — Discharge Instructions (Signed)
OPTIONS FOR DENTAL FOLLOW UP CARE ° °[place] Department of Health and Human Services - Local Safety Net Dental Clinics °http://www.ncdhhs.gov/dph/oralhealth/services/safetynetclinics.htm °  °[facility] (336-562-3123) ° °[facility] (919-933-9087) ° °[facility] (919-663-1744 ext [phone]) ° °[facility] ([phone]) ° °[facility] ([phone]) °This clinic caters to the indigent population and is on a lottery system. °Location: °[facility], [address], [place] °Clinic Hours: °Wednesdays from 6pm - 9pm, patients seen by a lottery system. °For dates, call or go to [email or web address].edu/[facility]/patients/Dental-[facility] °Services: °Cleanings, fillings and simple extractions. °Payment Options: °DENTAL WORK IS FREE OF CHARGE. Bring proof of income or support. °Best way to get seen: °Arrive at 5:15 pm - this is a lottery, NOT first come/first serve, so arriving earlier will not increase your chances of being seen. °  °  °[facility] °[phone] °Select option 1 for emergencies °  °Location: °[facility], [address], [place] °Clinic Hours: °No walk-ins accepted - call the day before to schedule an appointment. °Check in times are 9:30 am and 1:30 pm. °Services: °Simple extractions, temporary fillings, pulpectomy/pulp debridement, uncomplicated abscess drainage. °Payment Options: °PAYMENT IS DUE AT THE TIME OF SERVICE.  Fee is usually $100-200, additional surgical procedures (e.g. abscess drainage) may be extra. °Cash, checks, Visa/MasterCard accepted.  Can file Medicaid if patient is covered for dental - patient should call case worker to check. °No discount for [facility] patients. °Best way to get seen: °MUST call the day before and get onto the schedule. Can usually be seen the next 1-2 days. No walk-ins accepted. °  °  °[facility] °[phone] °   °Location: °[facility], [address], [place] °Clinic Hours: °M, W, Th, F 8am or 1:30pm, Tues 9a or 1:30 - first come/first served. °Services: °Simple extractions, temporary fillings, uncomplicated abscess drainage.  You do not need to be an [place] resident. °Payment Options: °PAYMENT IS DUE AT THE TIME OF SERVICE. °Dental insurance, otherwise sliding scale - bring proof of income or support. °Depending on income and treatment needed, cost is usually $50-200. °Best way to get seen: °Arrive early as it is first come/first served. °  °  °[facility] °[phone] °  °Location: °[address] °Clinic Hours: °Mon-Thu 8a-5p °Services: °Most basic dental services including extractions and fillings. °Payment Options: °PAYMENT IS DUE AT THE TIME OF SERVICE. °Sliding scale, up to 50% off - bring proof if income or support. °Medicaid with dental option accepted. °Best way to get seen: °Call to schedule an appointment, can usually be seen within 2 weeks OR they will try to see walk-ins - show up at 8a or 2p (you may have to wait). °  °  °[facility] °[phone] °[place] RESIDENTS ONLY °  °Location: °[facility], [address], [place], [place] [address] °Clinic Hours: By appointment only. °Monday - Thursday 8am-5pm, Friday 8am-12pm °Services: Cleanings, fillings, extractions. °Payment Options: °PAYMENT IS DUE AT THE TIME OF SERVICE. °Cash, Visa or MasterCard. Sliding scale - $30 minimum per service. °Best way to get seen: °Come in to office, complete packet and make an appointment - need proof of income °or support monies for each household member and proof of [place] residence. °Usually takes about a month to get in. °  °  °[facility] °[phone] °  °Location: °[address]   [place] °Clinic Hours: Walk-in Urgent Care Dental Services are offered Monday-Friday  mornings only. °The numbers of emergencies accepted daily is limited to the number of °providers available. °Maximum 15 - Mondays, Wednesdays & Thursdays °Maximum 10 - Tuesdays & Fridays °Services: °You do not need to be a [place] resident to be seen for a dental emergency. °Emergencies are defined as pain, swelling, abnormal bleeding, or dental trauma. Walkins will receive x-rays if needed. °NOTE: Dental cleaning is not an emergency. °Payment Options: °PAYMENT IS DUE AT THE TIME OF SERVICE. °Minimum co-pay is $40.00 for uninsured patients. °Minimum co-pay is $3.00 for Medicaid with dental coverage. °Dental Insurance is accepted and must be presented at time of visit. °Medicare does not cover dental. °Forms of payment: Cash, credit card, checks. °Best way to get seen: °If not previously registered with the clinic, walk-in dental registration begins at 7:15 am and is on a first come/first serve basis. °If previously registered with the clinic, call to make an appointment. °  °  °[facility] °[phone] °[place] RESIDENTS ONLY °  °Location: °[address], [place], [place] °Clinic Hours: °Mon-Thu 10a-2p °Services: Extractions only! °Payment Options: °FREE (donations accepted) - bring proof of income or support °Best way to get seen: °Call and schedule an appointment OR come at 8am on the 1st Monday of every month (except for holidays) when it is first come/first served. °  °  °[facility] °[phone] °  °Location: °[address], [place] °Clinic Hours: °Friday mornings °Services, Payment Options, Best way to get seen: °Call for info °

## 2015-05-26 NOTE — ED Notes (Signed)
Pt c/o right lower toothache for the past 4 days

## 2015-12-22 ENCOUNTER — Emergency Department: Payer: 59

## 2015-12-22 ENCOUNTER — Encounter: Payer: Self-pay | Admitting: *Deleted

## 2015-12-22 ENCOUNTER — Emergency Department
Admission: EM | Admit: 2015-12-22 | Discharge: 2015-12-22 | Disposition: A | Discharge status: Home / Self Care | Payer: 59 | Attending: Emergency Medicine | Admitting: Emergency Medicine

## 2015-12-22 DIAGNOSIS — Y990 Civilian activity done for income or pay: Secondary | ICD-10-CM | POA: Insufficient documentation

## 2015-12-22 DIAGNOSIS — Y929 Unspecified place or not applicable: Secondary | ICD-10-CM | POA: Insufficient documentation

## 2015-12-22 DIAGNOSIS — X500XXA Overexertion from strenuous movement or load, initial encounter: Secondary | ICD-10-CM | POA: Insufficient documentation

## 2015-12-22 DIAGNOSIS — L729 Follicular cyst of the skin and subcutaneous tissue, unspecified: Secondary | ICD-10-CM | POA: Insufficient documentation

## 2015-12-22 DIAGNOSIS — F17210 Nicotine dependence, cigarettes, uncomplicated: Secondary | ICD-10-CM | POA: Insufficient documentation

## 2015-12-22 DIAGNOSIS — Z792 Long term (current) use of antibiotics: Secondary | ICD-10-CM | POA: Insufficient documentation

## 2015-12-22 DIAGNOSIS — Y9389 Activity, other specified: Secondary | ICD-10-CM | POA: Diagnosis not present

## 2015-12-22 DIAGNOSIS — R51 Headache: Secondary | ICD-10-CM

## 2015-12-22 DIAGNOSIS — R519 Headache, unspecified: Secondary | ICD-10-CM

## 2015-12-22 DIAGNOSIS — Z79899 Other long term (current) drug therapy: Secondary | ICD-10-CM | POA: Diagnosis not present

## 2015-12-22 DIAGNOSIS — S29012A Strain of muscle and tendon of back wall of thorax, initial encounter: Secondary | ICD-10-CM

## 2015-12-22 DIAGNOSIS — S29011A Strain of muscle and tendon of front wall of thorax, initial encounter: Secondary | ICD-10-CM | POA: Diagnosis not present

## 2015-12-22 LAB — URINALYSIS COMPLETE WITH MICROSCOPIC (ARMC ONLY)
Bilirubin Urine: NEGATIVE
Glucose, UA: NEGATIVE mg/dL
Ketones, ur: NEGATIVE mg/dL
Nitrite: NEGATIVE
Protein, ur: NEGATIVE mg/dL
Specific Gravity, Urine: 1.021 (ref 1.005–1.030)
pH: 6 (ref 5.0–8.0)

## 2015-12-22 LAB — POCT PREGNANCY, URINE: Preg Test, Ur: NEGATIVE

## 2015-12-22 MED ORDER — PROCHLORPERAZINE EDISYLATE 5 MG/ML IJ SOLN
10.0000 mg | Freq: Once | INTRAMUSCULAR | Status: AC
  Administered 2015-12-22: 10 mg via INTRAMUSCULAR
  Filled 2015-12-22: qty 2

## 2015-12-22 MED ORDER — MELOXICAM 15 MG PO TABS: 15.0000 mg | ORAL_TABLET | Freq: Every day | ORAL | Status: AC

## 2015-12-22 MED ORDER — METHOCARBAMOL 500 MG PO TABS: 500.0000 mg | ORAL_TABLET | Freq: Four times a day (QID) | ORAL | Status: AC

## 2015-12-22 MED ORDER — DIPHENHYDRAMINE HCL 50 MG/ML IJ SOLN
50.0000 mg | Freq: Once | INTRAMUSCULAR | Status: AC
  Administered 2015-12-22: 50 mg via INTRAMUSCULAR
  Filled 2015-12-22: qty 1

## 2015-12-22 MED ORDER — KETOROLAC TROMETHAMINE 60 MG/2ML IM SOLN
60.0000 mg | Freq: Once | INTRAMUSCULAR | Status: AC
  Administered 2015-12-22: 60 mg via INTRAMUSCULAR
  Filled 2015-12-22: qty 2

## 2015-12-22 NOTE — Discharge Instructions (Signed)
Migraine Headache A migraine headache is an intense, throbbing pain on one or both sides of your head. A migraine can last for 30 minutes to several hours. CAUSES  The exact cause of a migraine headache is not always known. However, a migraine may be caused when nerves in the brain become irritated and release chemicals that cause inflammation. This causes pain. Certain things may also trigger migraines, such as:  Alcohol.  Smoking.  Stress.  Menstruation.  Aged cheeses.  Foods or drinks that contain nitrates, glutamate, aspartame, or tyramine.  Lack of sleep.  Chocolate.  Caffeine.  Hunger.  Physical exertion.  Fatigue.  Medicines used to treat chest pain (nitroglycerine), birth control pills, estrogen, and some blood pressure medicines. SIGNS AND SYMPTOMS  Pain on one or both sides of your head.  Pulsating or throbbing pain.  Severe pain that prevents daily activities.  Pain that is aggravated by any physical activity.  Nausea, vomiting, or both.  Dizziness.  Pain with exposure to bright lights, loud noises, or activity.  General sensitivity to bright lights, loud noises, or smells. Before you get a migraine, you may get warning signs that a migraine is coming (aura). An aura may include:  Seeing flashing lights.  Seeing bright spots, halos, or zigzag lines.  Having tunnel vision or blurred vision.  Having feelings of numbness or tingling.  Having trouble talking.  Having muscle weakness. DIAGNOSIS  A migraine headache is often diagnosed based on:  Symptoms.  Physical exam.  A CT scan or MRI of your head. These imaging tests cannot diagnose migraines, but they can help rule out other causes of headaches. TREATMENT Medicines may be given for pain and nausea. Medicines can also be given to help prevent recurrent migraines.  HOME CARE INSTRUCTIONS  Only take over-the-counter or prescription medicines for pain or discomfort as directed by your  health care provider. The use of long-term narcotics is not recommended.  Lie down in a dark, quiet room when you have a migraine.  Keep a journal to find out what may trigger your migraine headaches. For example, write down:  What you eat and drink.  How much sleep you get.  Any change to your diet or medicines.  Limit alcohol consumption.  Quit smoking if you smoke.  Get 7-9 hours of sleep, or as recommended by your health care provider.  Limit stress.  Keep lights dim if bright lights bother you and make your migraines worse. SEEK IMMEDIATE MEDICAL CARE IF:   Your migraine becomes severe.  You have a fever.  You have a stiff neck.  You have vision loss.  You have muscular weakness or loss of muscle control.  You start losing your balance or have trouble walking.  You feel faint or pass out.  You have severe symptoms that are different from your first symptoms. MAKE SURE YOU:   Understand these instructions.  Will watch your condition.  Will get help right away if you are not doing well or get worse.   This information is not intended to replace advice given to you by your health care provider. Make sure you discuss any questions you have with your health care provider.   Document Released: 06/26/2005 Document Revised: 07/17/2014 Document Reviewed: 03/03/2013 Elsevier Interactive Patient Education 2016 Else[employer] Inc.  Muscle Strain A muscle strain is an injury that occurs when a muscle is stretched beyond its normal length. Usually a small number of muscle fibers are torn when this happens. Muscle strain is  rated in degrees. First-degree strains have the least amount of muscle fiber tearing and pain. Second-degree and third-degree strains have increasingly more tearing and pain.  Usually, recovery from muscle strain takes 1-2 weeks. Complete healing takes 5-6 weeks.  CAUSES  Muscle strain happens when a sudden, violent force placed on a muscle stretches it too  far. This may occur with lifting, sports, or a fall.  RISK FACTORS Muscle strain is especially common in athletes.  SIGNS AND SYMPTOMS At the site of the muscle strain, there may be:  Pain.  Bruising.  Swelling.  Difficulty using the muscle due to pain or lack of normal function. DIAGNOSIS  Your health care provider will perform a physical exam and ask about your medical history. TREATMENT  Often, the best treatment for a muscle strain is resting, icing, and applying cold compresses to the injured area.  HOME CARE INSTRUCTIONS   Use the PRICE method of treatment to promote muscle healing during the first 2-3 days after your injury. The PRICE method involves:  Protecting the muscle from being injured again.  Restricting your activity and resting the injured body part.  Icing your injury. To do this, put ice in a plastic bag. Place a towel between your skin and the bag. Then, apply the ice and leave it on from 15-20 minutes each hour. After the third day, switch to moist heat packs.  Apply compression to the injured area with a splint or elastic bandage. Be careful not to wrap it too tightly. This may interfere with blood circulation or increase swelling.  Elevate the injured body part above the level of your heart as often as you can.  Only take over-the-counter or prescription medicines for pain, discomfort, or fever as directed by your health care provider.  Warming up prior to exercise helps to prevent future muscle strains. SEEK MEDICAL CARE IF:   You have increasing pain or swelling in the injured area.  You have numbness, tingling, or a significant loss of strength in the injured area. MAKE SURE YOU:   Understand these instructions.  Will watch your condition.  Will get help right away if you are not doing well or get worse.   This information is not intended to replace advice given to you by your health care provider. Make sure you discuss any questions you have  with your health care provider.   Document Released: 06/26/2005 Document Revised: 04/16/2013 Document Reviewed: 01/23/2013 Elsevier Interactive Patient Education [employer] Elsevier Inc.

## 2015-12-22 NOTE — ED Notes (Signed)
States she has back pain, lifted a heavy box earlier, states she is not sure if it is her back or a kidney stones, right sided pain, also states a knot on her right temple that when she pushes she feels a headache starting, pt awake and alert in no acute distress

## 2015-12-22 NOTE — ED Provider Notes (Signed)
[facility] Emergency Department Provider Note  ____________________________________________  Time seen: Approximately 6:19 PM  I have reviewed the triage vital signs and the nursing notes.   HISTORY  Chief Complaint Back Pain and Headache    HPI Melissa Larson is a 30 y.o. female who presents emergency department complaining of right mid back pain. Patient states that she developed lower back pain after lifting heavy items at work. Patient does have a history of kidney stones and she states that the pain typically presents in the same region. She denies any radiation into the groin. She denies any hematuria. Patient denies any abdominal pain or nausea or vomiting. Symptoms have been ongoing for the last 2-3 days.  Patient is also endorsing a headache for the last 2 weeks. Patient reports feeling a "knot" to the right temporal region for a couple of months. Denies any visual changes, neck pain, chest pain, shortness of breath. Patient does have a history of migraines. Patient is tried over-the-counter medications for both complaints with minimal relief.   Past Medical History  Diagnosis Date  . Renal disorder     There are no active problems to display for this patient.   Past Surgical History  Procedure Laterality Date  . Lithotripsy    . Cholecystectomy    . Cesarean section      Current Outpatient Rx  Name  Route  Sig  Dispense  Refill  . chlorpheniramine (CHLOR-TRIMETON) 4 MG tablet   Oral   Take 1 tablet (4 mg total) by mouth 2 (two) times daily as needed for allergies or rhinitis.   30 tablet   0   . guaiFENesin-codeine 100-10 MG/5ML syrup   Oral   Take 10 mLs by mouth every 4 (four) hours as needed for cough.   180 mL   0   . hydrocortisone (ANUSOL-HC) 2.5 % rectal cream      Apply rectally 2 times daily   30 g   1   . meloxicam (MOBIC) 15 MG tablet   Oral   Take 1 tablet (15 mg total) by mouth daily.   30 tablet   0   .  methocarbamol (ROBAXIN) 500 MG tablet   Oral   Take 1 tablet (500 mg total) by mouth 4 (four) times daily.   16 tablet   0   . penicillin v potassium (VEETID) 500 MG tablet   Oral   Take 1 tablet (500 mg total) by mouth 4 (four) times daily.   40 tablet   0   . traMADol (ULTRAM) 50 MG tablet   Oral   Take 1 tablet (50 mg total) by mouth every 6 (six) hours as needed.   20 tablet   0     Allergies Aspirin  History reviewed. No pertinent family history.  Social History Social History  Substance Use Topics  . Smoking status: Current Every Day Smoker -- 0.50 packs/day    Types: Cigarettes  . Smokeless tobacco: None  . Alcohol Use: No     Review of Systems  Constitutional: No fever/chills Eyes: No visual changes.  Cardiovascular: no chest pain. Respiratory: no cough. No SOB. Gastrointestinal: No abdominal pain.  No nausea, no vomiting.   Genitourinary: Negative for dysuria. No hematuria Musculoskeletal: Positive for right mid back pain. Skin: Negative for rash, abrasions, lacerations, ecchymosis. Neurological: Positive for headache but denies focal weakness or numbness. 10-point ROS otherwise negative.  ____________________________________________   PHYSICAL EXAM:  VITAL SIGNS: ED Triage  Vitals  Enc Vitals Group     BP 12/22/15 1759 115/82 mmHg     Pulse Rate 12/22/15 1759 83     Resp 12/22/15 1759 18     Temp 12/22/15 1759 98.2 F (36.8 C)     Temp Source 12/22/15 1759 Oral     SpO2 12/22/15 1759 99 %     Weight 12/22/15 1759 220 lb (99.791 kg)     Height 12/22/15 1759 4\' 7"  (1.397 m)     Head Cir --      Peak Flow --      Pain Score 12/22/15 1759 4     Pain Loc --      Pain Edu? --      Excl. in GC? --      Constitutional: Alert and oriented. Well appearing and in no acute distress. Eyes: Conjunctivae are normal. PERRL. EOMI. Head: Atraumatic.No signs of ecchymosis, contusion, races, laceration. Small, well-circumscribed, mobile lesion noted to  the right temporal region. Area is nontender to palpation. No overlying erythema or edema. ENT:      Ears:       Nose: No congestion/rhinnorhea.      Mouth/Throat: Mucous membranes are moist.  Neck: No stridor.    Cardiovascular: Normal rate, regular rhythm. Normal S1 and S2.  Good peripheral circulation. Respiratory: Normal respiratory effort without tachypnea or retractions. Lungs CTAB. Good air entry to the bases with no decreased or absent breath sounds. Gastrointestinal: Bowel sounds 4 quadrants. Soft and nontender to palpation. No guarding or rigidity. No palpable masses. No distention. No CVA tenderness Musculoskeletal: Full range of motion to all extremities. No gross deformities appreciated. Neurologic:  Normal speech and language. No gross focal neurologic deficits are appreciated.  Skin:  Skin is warm, dry and intact. No rash noted. Psychiatric: Mood and affect are normal. Speech and behavior are normal. Patient exhibits appropriate insight and judgement.   ____________________________________________   LABS (all labs ordered are listed, but only abnormal results are displayed)  Labs Reviewed  URINALYSIS COMPLETEWITH MICROSCOPIC ([facility] ONLY) - Abnormal; Notable for the following:    Color, Urine YELLOW (*)    APPearance HAZY (*)    Hgb urine dipstick 1+ (*)    Leukocytes, UA 2+ (*)    Bacteria, UA RARE (*)    Squamous Epithelial / LPF 0-5 (*)    All other components within normal limits  POCT PREGNANCY, URINE   ____________________________________________  EKG   ____________________________________________  RADIOLOGY Festus Barren Ayiden Milliman, personally viewed and evaluated these images (plain radiographs) as part of my medical decision making, as well as reviewing the written report by the radiologist.  Dg Abd 1 View  12/22/2015  CLINICAL DATA:  Back pain, history of kidney stones EXAM: ABDOMEN - 1 VIEW COMPARISON:  None. FINDINGS: There is normal small bowel gas  pattern. Calcification in lower pole region of the left kidney measures 4 mm. No ureteral calcifications are noted. IUD midline pelvis. Postcholecystectomy surgical clips are noted. IMPRESSION: Left nephrolithiasis. No ureteral calcifications. Postcholecystectomy surgical clips are noted. Electronically Signed   By: Natasha Mead M.D.   On: 12/22/2015 19:48    ____________________________________________    PROCEDURES  Procedure(s) performed:       Medications  ketorolac (TORADOL) injection 60 mg (60 mg Intramuscular Given 12/22/15 2004)  prochlorperazine (COMPAZINE) injection 10 mg (10 mg Intramuscular Given 12/22/15 2005)  diphenhydrAMINE (BENADRYL) injection 50 mg (50 mg Intramuscular Given 12/22/15 2004)     ____________________________________________  INITIAL IMPRESSION / ASSESSMENT AND PLAN / ED COURSE  Pertinent labs & imaging results that were available during my care of the patient were reviewed by me and considered in my medical decision making (see chart for details).  Patient's diagnosis is consistent with Headache and thoracic muscle strain. Patient is given a headache cocktail emergency department. She will be prescribed anti-inflammatories and muscle relaxers for her back. Patient will follow-up with dermatology for likely cystic structure in the right temporal region. No clinical indication of temporal arteritis and is such no imaging of the head or labs are ordered for evaluation.  Patient is given ED precautions to return to the ED for any worsening or new symptoms.     ____________________________________________  FINAL CLINICAL IMPRESSION(S) / ED DIAGNOSES  Final diagnoses:  Acute nonintractable headache, unspecified headache type  Scalp cyst  Strain of thoracic paraspinal muscles excluding T1 and T2 levels, initial encounter      NEW MEDICATIONS STARTED DURING THIS VISIT:  Discharge Medication List as of 12/22/2015  8:01 PM    START taking these  medications   Details  meloxicam (MOBIC) 15 MG tablet Take 1 tablet (15 mg total) by mouth daily., Starting 12/22/2015, Until Discontinued, Print    methocarbamol (ROBAXIN) 500 MG tablet Take 1 tablet (500 mg total) by mouth 4 (four) times daily., Starting 12/22/2015, Until Discontinued, Print            This chart was dictated using voice recognition software/Dragon. Despite best efforts to proofread, errors can occur which can change the meaning. Any change was purely unintentional.    Racheal PatchesJonathan D Druscilla Petsch, PA-C 12/22/15 2041  Sharman CheekPhillip Stafford, MD 12/23/15 [phone]

## 2015-12-22 NOTE — ED Notes (Signed)
See triage note  States she developed lower back pain after lifting something . Pain is to right side of back also having some pain to right temporal area    Denies any n/v/ or fever

## 2016-02-24 IMAGING — CR DG CHEST 2V
1 series · 2 of 2 positions shown · non-contrast
Comparison: 09/23/2006

CLINICAL DATA: Cough.  Wheezing.  Sore throat.  Smoker.

EXAM:
CHEST  2 VIEW

[Series 1: w chest pa · 0.14mm/px · 2 of 2 slices shown]
[im 1/2]
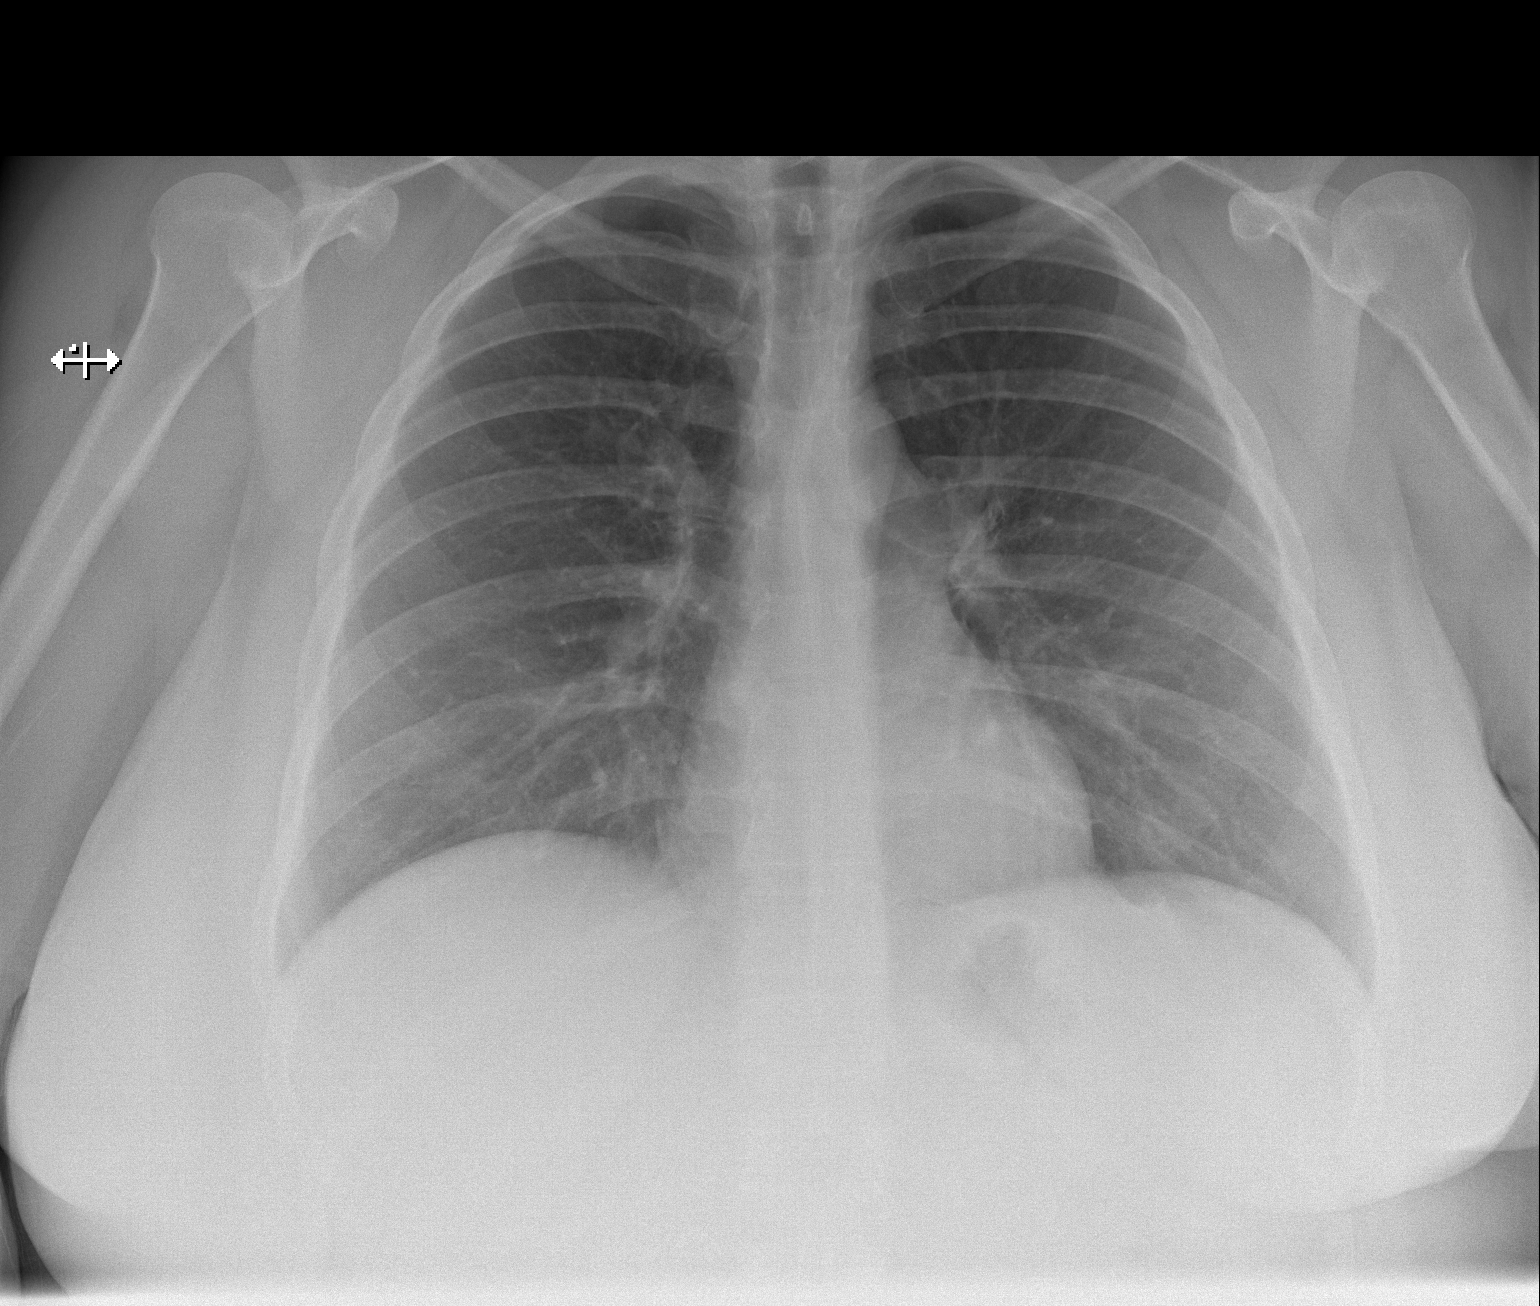
[im 2/2]
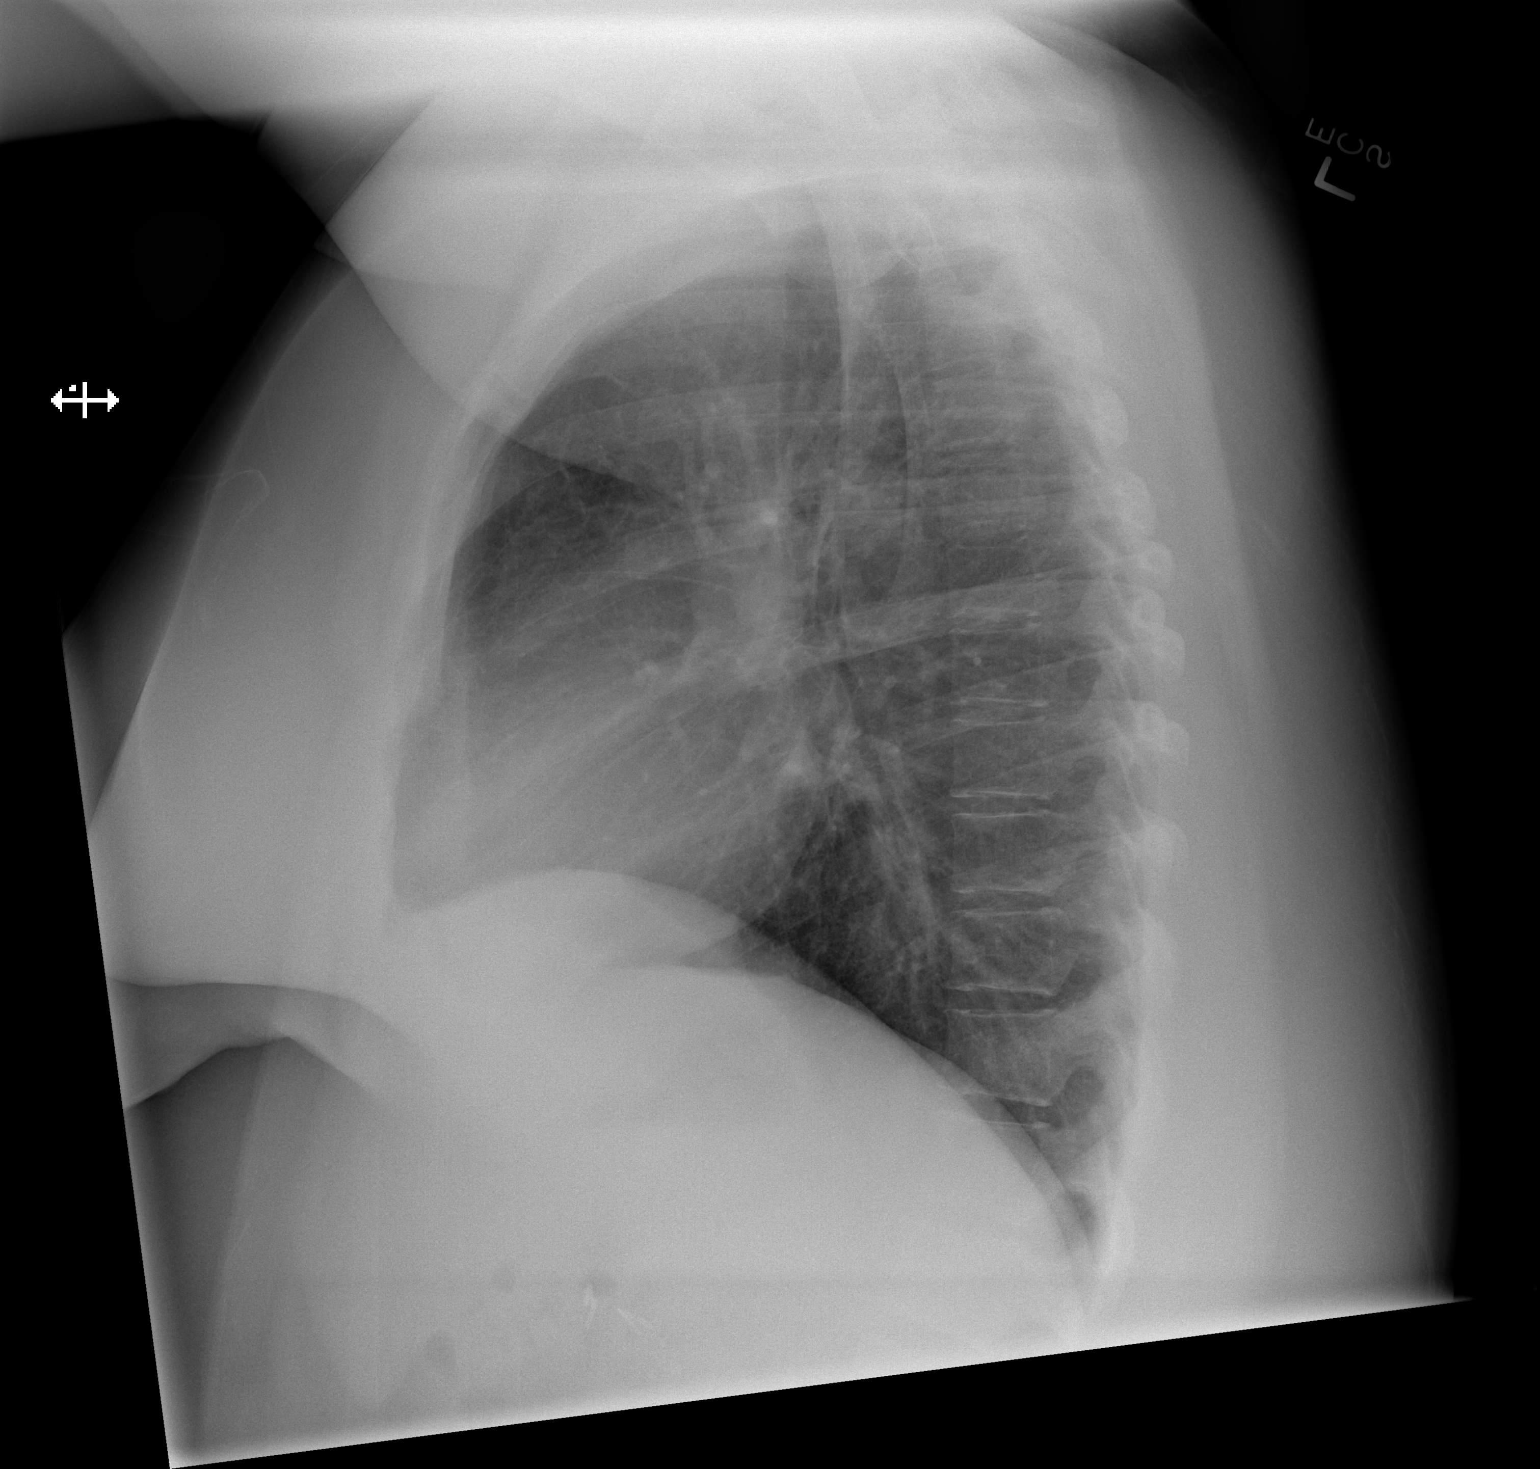

[2 of 2 positions shown; findings below may reference images not displayed]

FINDINGS: The heart size and mediastinal contours are within normal limits.
Both lungs are clear. The visualized skeletal structures are
unremarkable.
IMPRESSION: No active cardiopulmonary disease.

## 2016-05-08 IMAGING — CR DG CHEST 2V
1 series · 2 of 2 positions shown · non-contrast
Comparison: October 10, 2013

CLINICAL DATA: Cough and congestion

EXAM:
CHEST  2 VIEW

[Series 1: pa · 0.17mm/px · 2 of 2 slices shown]
[im 1/2]
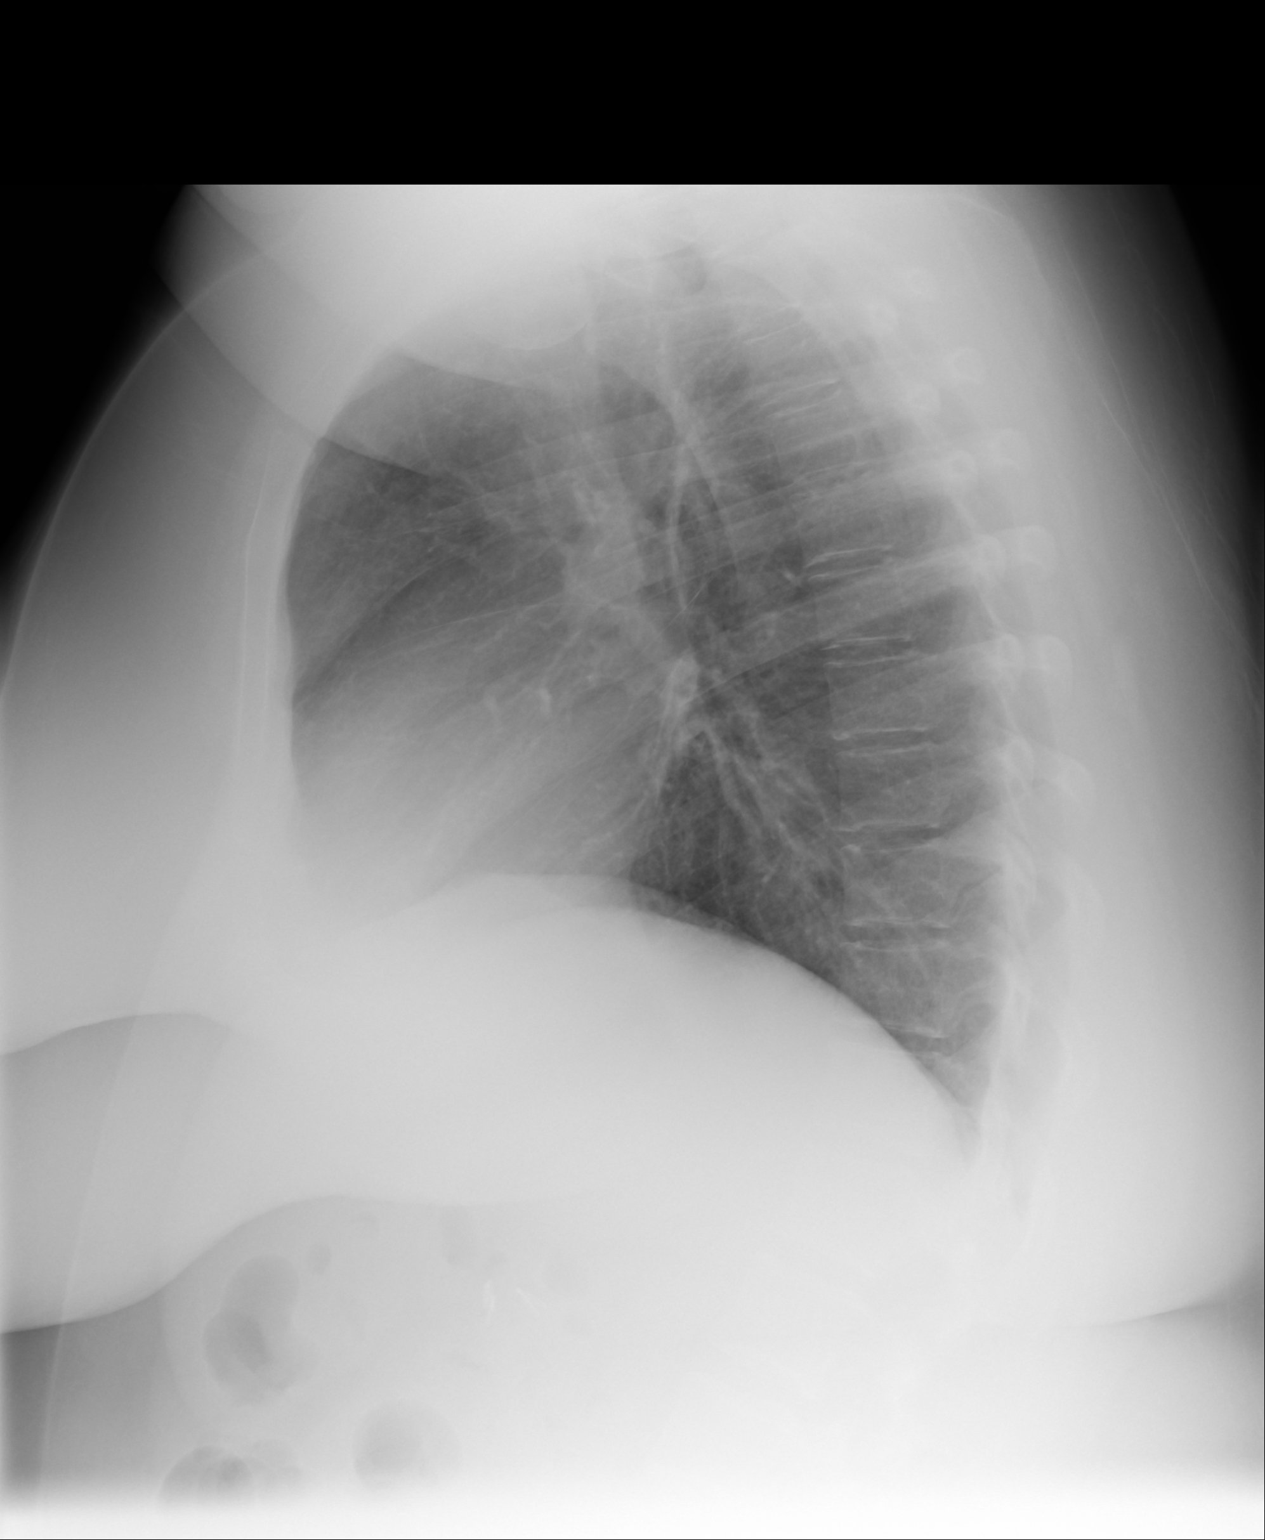
[im 2/2]
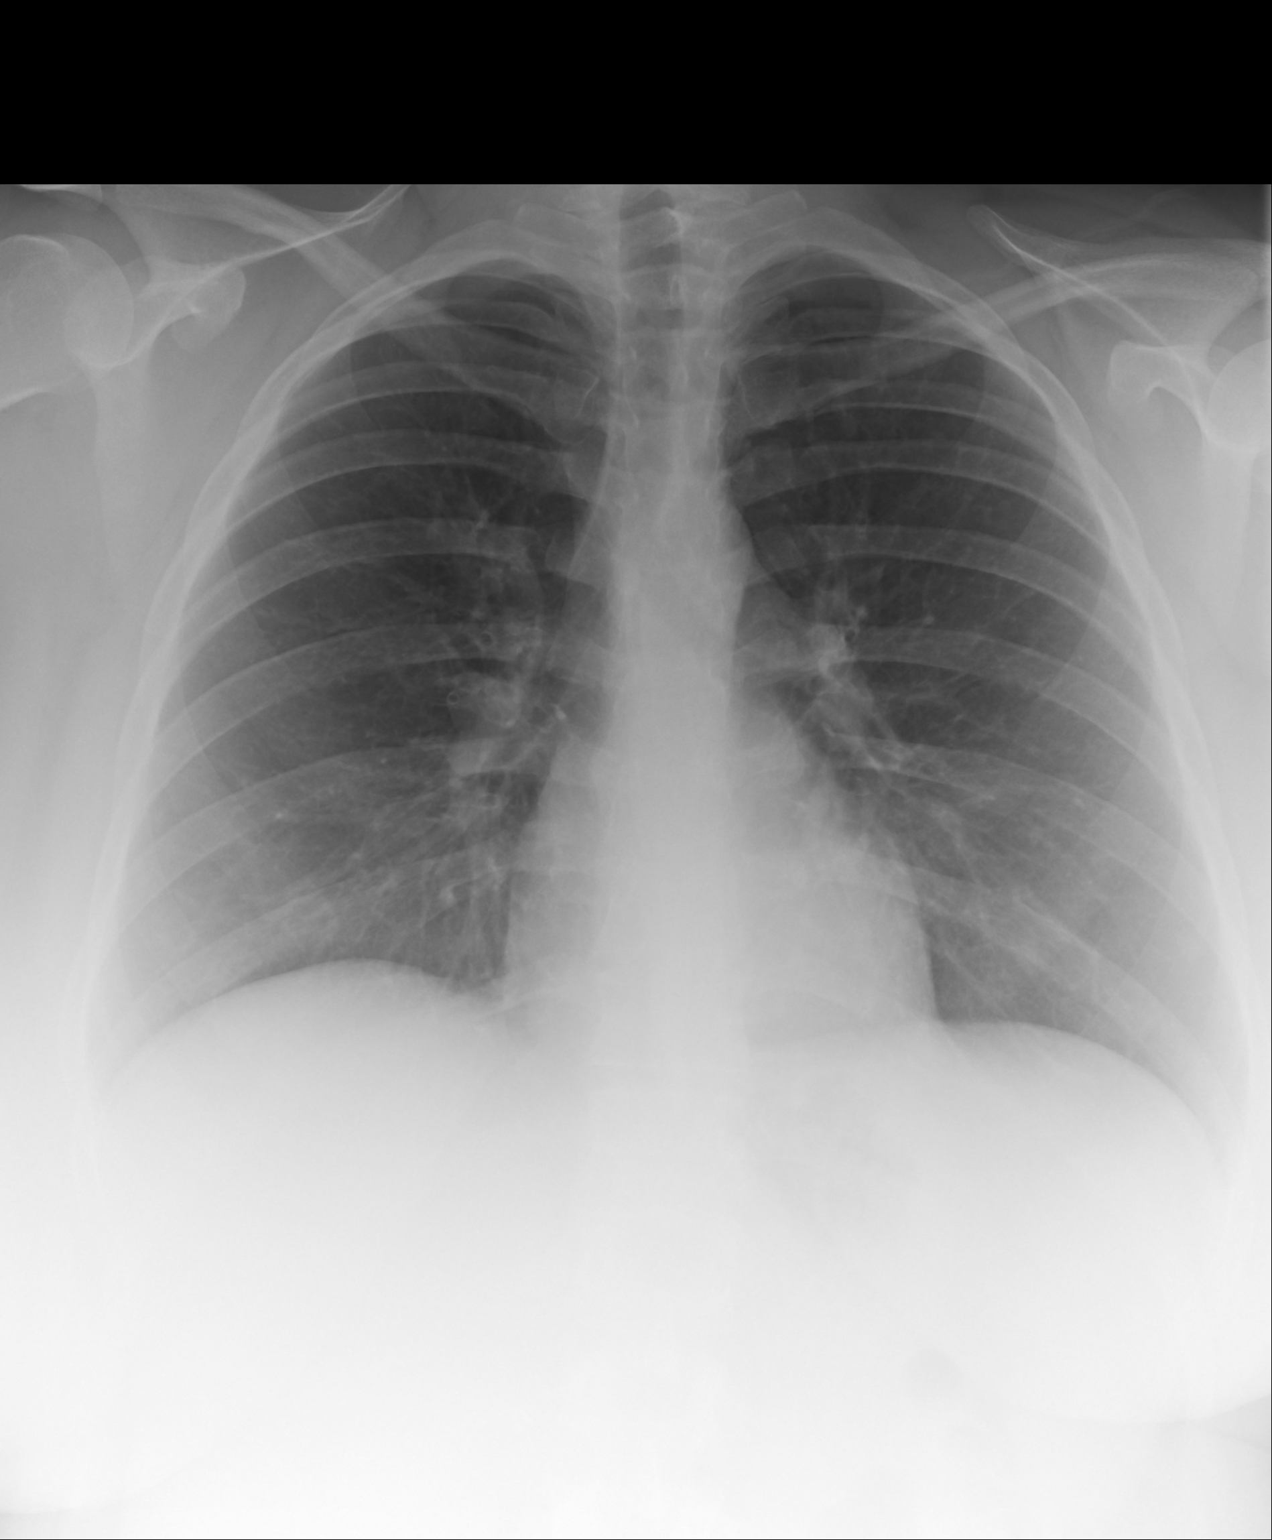

[2 of 2 positions shown; findings below may reference images not displayed]

FINDINGS: Lungs are clear. Heart size and pulmonary vascularity are normal. No
pneumothorax. No adenopathy. No bone lesions.
IMPRESSION: No abnormality noted.

## 2018-05-07 IMAGING — CR DG ABDOMEN 1V
1 series · 2 of 2 positions shown · non-contrast
Comparison: None.

CLINICAL DATA: Back pain, history of kidney stones

EXAM:
ABDOMEN - 1 VIEW

[Series 1: dg abd 1 view · 0.14mm/px · 2 of 2 slices shown]
[im 1/2]
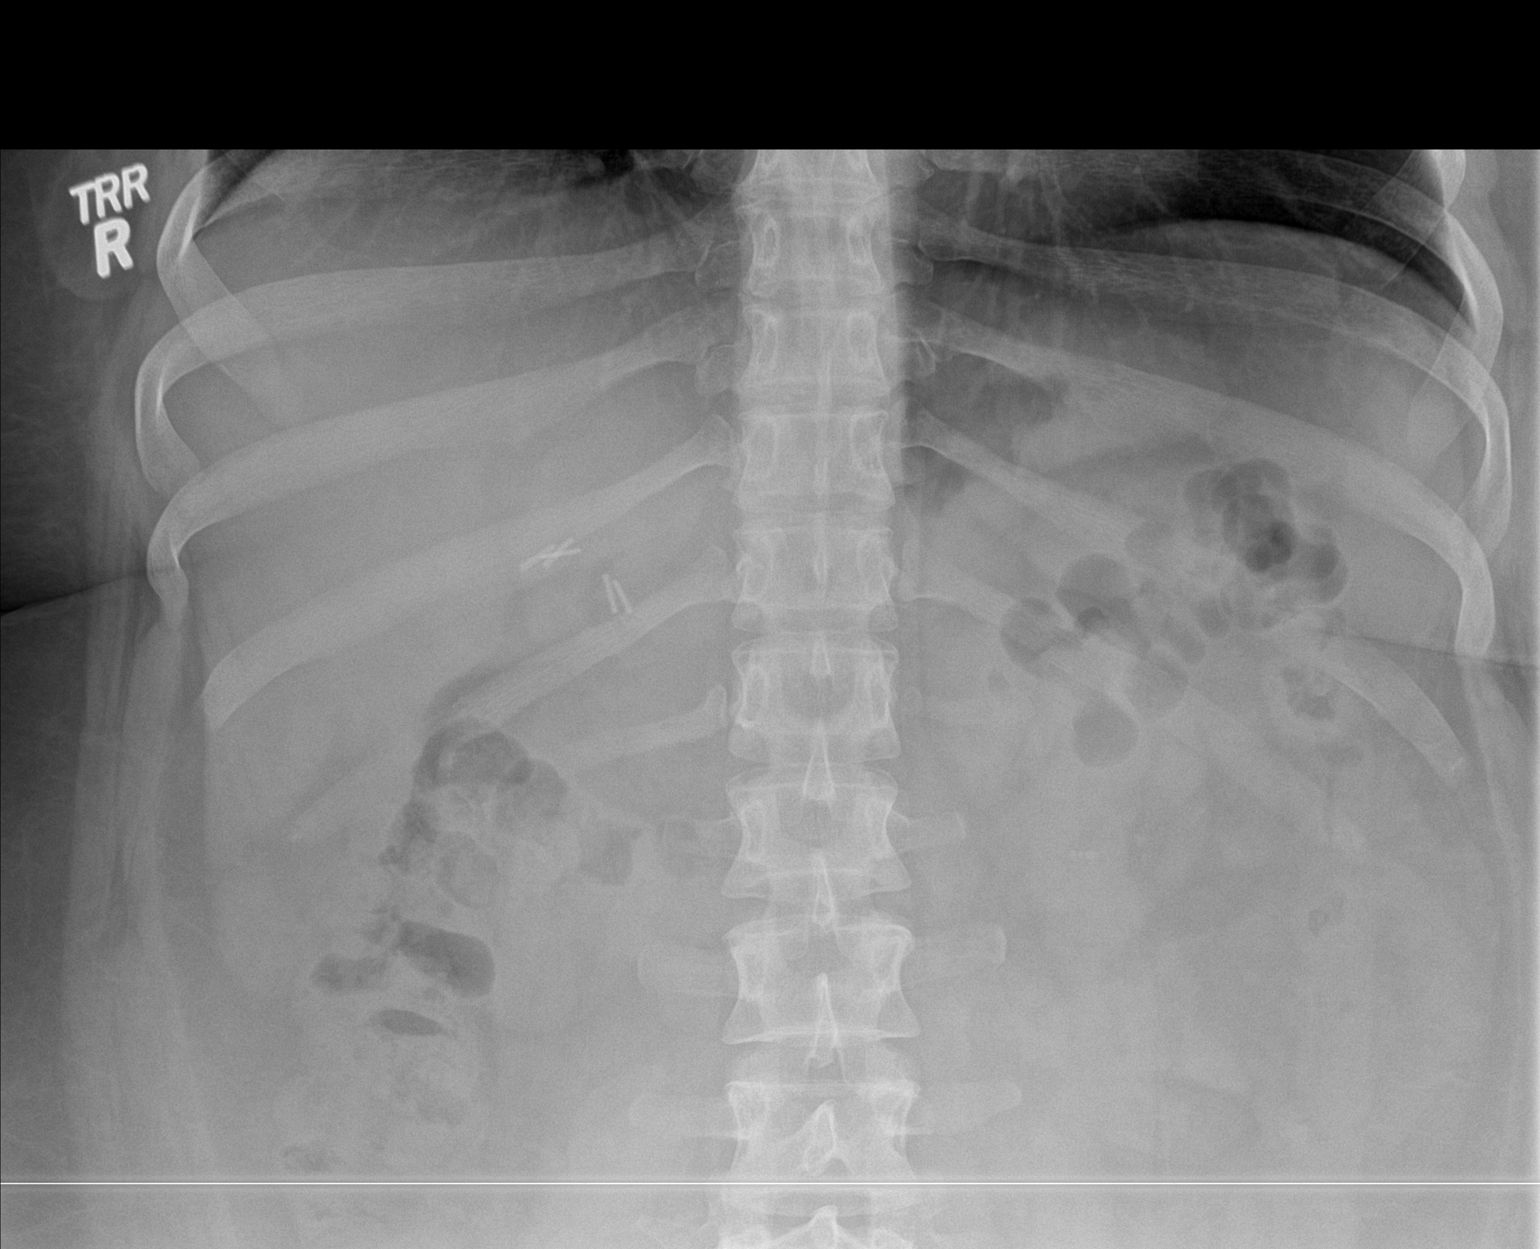
[im 2/2]
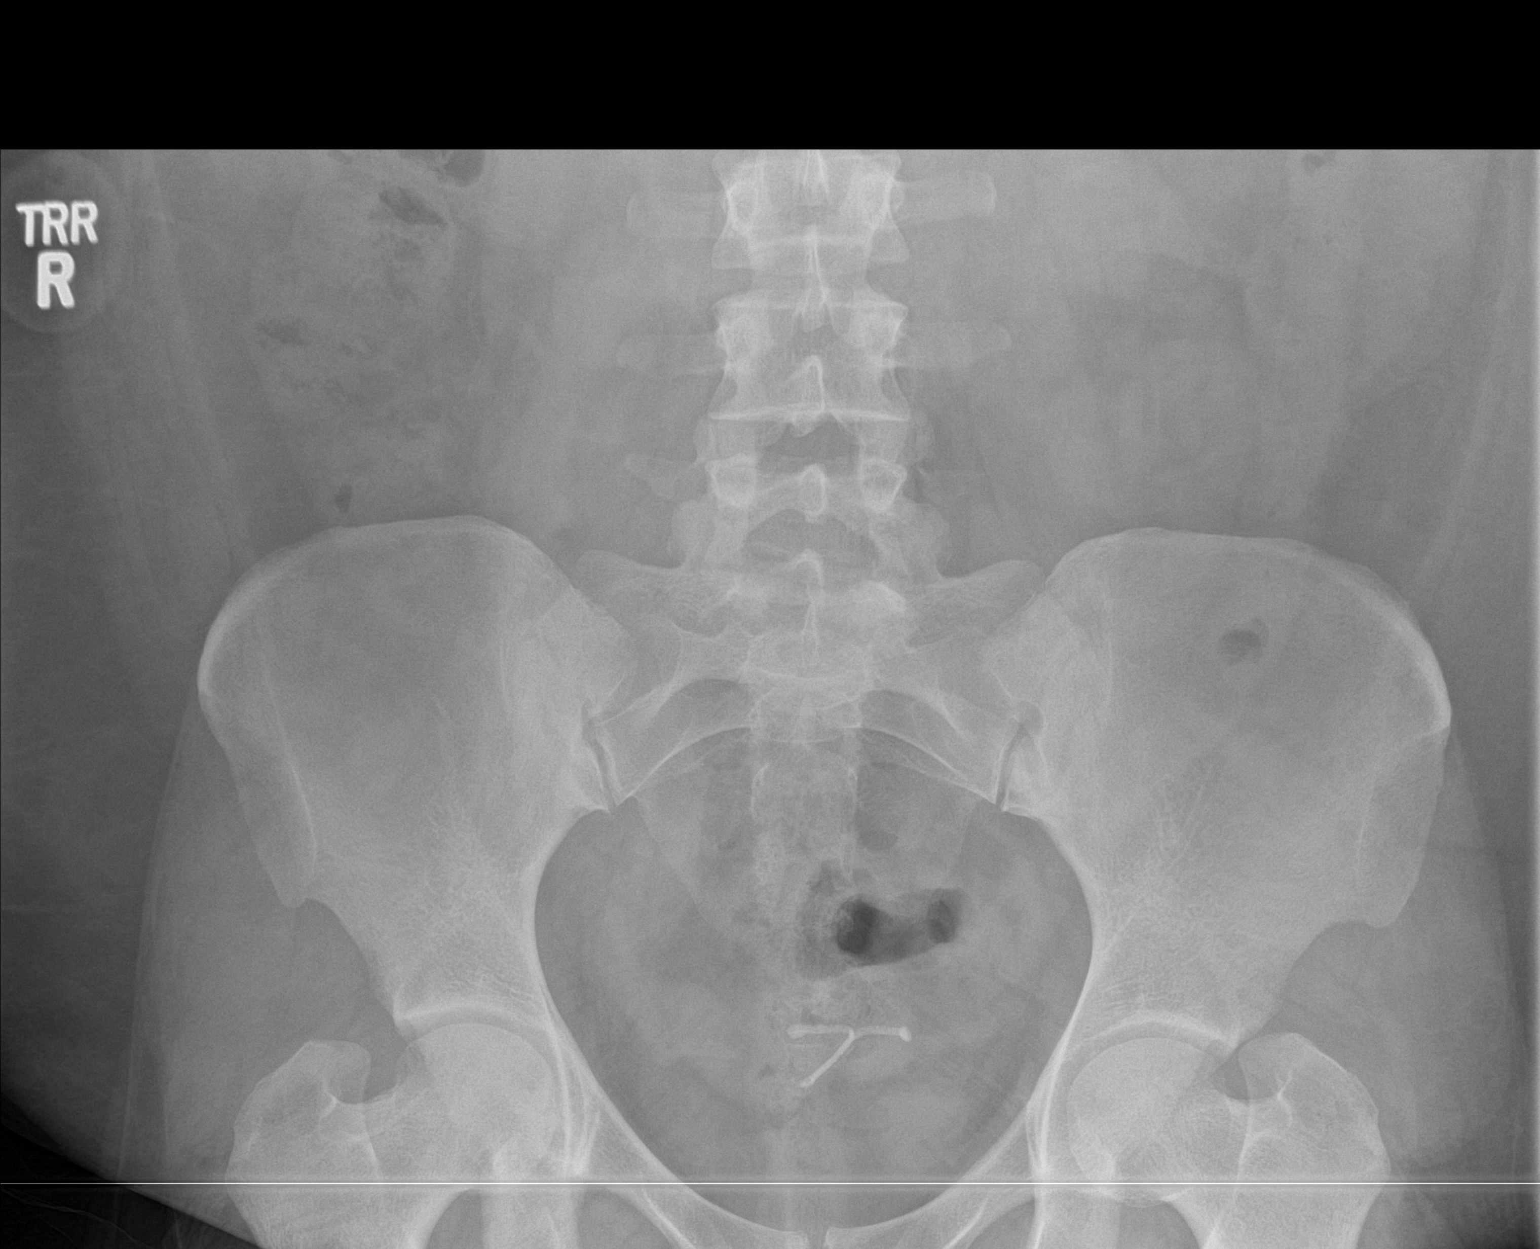

[2 of 2 positions shown; findings below may reference images not displayed]

FINDINGS: There is normal small bowel gas pattern. Calcification in lower pole
region of the left kidney measures 4 mm. No ureteral calcifications
are noted. IUD midline pelvis. Postcholecystectomy surgical clips
are noted.
IMPRESSION: Left nephrolithiasis. No ureteral calcifications.
Postcholecystectomy surgical clips are noted.
